# Patient Record
Sex: Female | Born: 1955 | Race: White | Hispanic: No | Marital: Single | Smoking: Former smoker
Health system: Southern US, Community
[De-identification: ages and names within clinical notes are randomized; demographics above are authoritative.]

## PROBLEM LIST (undated history)

## (undated) DIAGNOSIS — T8859XA Other complications of anesthesia, initial encounter: Secondary | ICD-10-CM

## (undated) DIAGNOSIS — F329 Major depressive disorder, single episode, unspecified: Secondary | ICD-10-CM

## (undated) DIAGNOSIS — G8929 Other chronic pain: Secondary | ICD-10-CM

## (undated) DIAGNOSIS — T7840XA Allergy, unspecified, initial encounter: Secondary | ICD-10-CM

## (undated) DIAGNOSIS — G4700 Insomnia, unspecified: Secondary | ICD-10-CM

## (undated) DIAGNOSIS — E785 Hyperlipidemia, unspecified: Secondary | ICD-10-CM

## (undated) DIAGNOSIS — M2540 Effusion, unspecified joint: Secondary | ICD-10-CM

## (undated) DIAGNOSIS — J302 Other seasonal allergic rhinitis: Secondary | ICD-10-CM

## (undated) DIAGNOSIS — M1990 Unspecified osteoarthritis, unspecified site: Secondary | ICD-10-CM

## (undated) DIAGNOSIS — R350 Frequency of micturition: Secondary | ICD-10-CM

## (undated) DIAGNOSIS — J45909 Unspecified asthma, uncomplicated: Secondary | ICD-10-CM

## (undated) DIAGNOSIS — H269 Unspecified cataract: Secondary | ICD-10-CM

## (undated) DIAGNOSIS — E079 Disorder of thyroid, unspecified: Secondary | ICD-10-CM

## (undated) DIAGNOSIS — R200 Anesthesia of skin: Secondary | ICD-10-CM

## (undated) DIAGNOSIS — IMO0001 Reserved for inherently not codable concepts without codable children: Secondary | ICD-10-CM

## (undated) DIAGNOSIS — M2550 Pain in unspecified joint: Secondary | ICD-10-CM

## (undated) DIAGNOSIS — N189 Chronic kidney disease, unspecified: Secondary | ICD-10-CM

## (undated) DIAGNOSIS — F32A Depression, unspecified: Secondary | ICD-10-CM

## (undated) DIAGNOSIS — M62838 Other muscle spasm: Secondary | ICD-10-CM

## (undated) DIAGNOSIS — Z8709 Personal history of other diseases of the respiratory system: Secondary | ICD-10-CM

## (undated) DIAGNOSIS — T4145XA Adverse effect of unspecified anesthetic, initial encounter: Secondary | ICD-10-CM

## (undated) DIAGNOSIS — K219 Gastro-esophageal reflux disease without esophagitis: Secondary | ICD-10-CM

## (undated) DIAGNOSIS — M549 Dorsalgia, unspecified: Secondary | ICD-10-CM

## (undated) DIAGNOSIS — R3915 Urgency of urination: Secondary | ICD-10-CM

## (undated) DIAGNOSIS — G4730 Sleep apnea, unspecified: Secondary | ICD-10-CM

## (undated) DIAGNOSIS — E119 Type 2 diabetes mellitus without complications: Secondary | ICD-10-CM

## (undated) HISTORY — DX: Chronic kidney disease, unspecified: N18.9

## (undated) HISTORY — DX: Disorder of thyroid, unspecified: E07.9

## (undated) HISTORY — PX: CARPAL TUNNEL RELEASE: SHX101

## (undated) HISTORY — DX: Allergy, unspecified, initial encounter: T78.40XA

## (undated) HISTORY — PX: COLONOSCOPY: SHX174

## (undated) HISTORY — DX: Sleep apnea, unspecified: G47.30

## (undated) HISTORY — PX: OTHER SURGICAL HISTORY: SHX169

---
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
## 2001-08-22 ENCOUNTER — Encounter: Payer: Self-pay | Admitting: Family Medicine

## 2001-08-22 ENCOUNTER — Encounter: Admission: RE | Admit: 2001-08-22 | Discharge: 2001-08-22 | Payer: Self-pay | Admitting: Family Medicine

## 2002-06-15 ENCOUNTER — Ambulatory Visit (HOSPITAL_BASED_OUTPATIENT_CLINIC_OR_DEPARTMENT_OTHER): Admission: RE | Admit: 2002-06-15 | Discharge: 2002-06-15 | Payer: Self-pay | Admitting: Neurology

## 2002-08-16 ENCOUNTER — Ambulatory Visit (HOSPITAL_BASED_OUTPATIENT_CLINIC_OR_DEPARTMENT_OTHER): Admission: RE | Admit: 2002-08-16 | Discharge: 2002-08-16 | Payer: Self-pay | Admitting: Neurology

## 2002-10-17 ENCOUNTER — Encounter: Payer: Self-pay | Admitting: Family Medicine

## 2002-10-17 ENCOUNTER — Encounter: Admission: RE | Admit: 2002-10-17 | Discharge: 2002-10-17 | Payer: Self-pay | Admitting: Family Medicine

## 2003-06-15 HISTORY — PX: OTHER SURGICAL HISTORY: SHX169

## 2003-11-14 ENCOUNTER — Ambulatory Visit (HOSPITAL_COMMUNITY): Admission: RE | Admit: 2003-11-14 | Discharge: 2003-11-15 | Payer: Self-pay | Admitting: Orthopedic Surgery

## 2004-02-18 ENCOUNTER — Emergency Department (HOSPITAL_COMMUNITY): Admission: EM | Admit: 2004-02-18 | Discharge: 2004-02-18 | Payer: Self-pay | Admitting: Emergency Medicine

## 2005-06-25 ENCOUNTER — Ambulatory Visit (HOSPITAL_COMMUNITY): Admission: RE | Admit: 2005-06-25 | Discharge: 2005-06-25 | Payer: Self-pay | Admitting: Family Medicine

## 2005-09-13 ENCOUNTER — Encounter: Admission: RE | Admit: 2005-09-13 | Discharge: 2005-09-13 | Payer: Self-pay | Admitting: Family Medicine

## 2005-09-14 ENCOUNTER — Encounter (HOSPITAL_COMMUNITY): Admission: RE | Admit: 2005-09-14 | Discharge: 2005-10-14 | Payer: Self-pay | Admitting: Orthopedic Surgery

## 2006-05-18 ENCOUNTER — Encounter: Admission: RE | Admit: 2006-05-18 | Discharge: 2006-05-18 | Payer: Self-pay | Admitting: Family Medicine

## 2006-06-09 ENCOUNTER — Ambulatory Visit: Payer: Self-pay | Admitting: Internal Medicine

## 2007-06-15 HISTORY — PX: GASTRIC BYPASS: SHX52

## 2007-10-18 ENCOUNTER — Ambulatory Visit: Payer: Self-pay | Admitting: Internal Medicine

## 2007-11-01 ENCOUNTER — Ambulatory Visit: Payer: Self-pay | Admitting: Internal Medicine

## 2009-11-28 ENCOUNTER — Encounter: Admission: RE | Admit: 2009-11-28 | Discharge: 2009-11-28 | Payer: Self-pay | Admitting: Family Medicine

## 2010-07-05 ENCOUNTER — Encounter: Payer: Self-pay | Admitting: Family Medicine

## 2010-10-30 NOTE — Op Note (Signed)
[name], Cristina Hansen                       ACCOUNT NO.:  [id number]   MEDICAL RECORD NO.:  [id number]                   PATIENT TYPE:  OIB   LOCATION:  2899                                 FACILITY:  [facility]   PHYSICIAN:  Nadara Mustard, M.D.                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   PROCEDURES:  1. Left knee unicompartmental arthroplasty medial joint line.  2. Partial patellectomy left knee.   SURGEON:  Nadara Mustard, M.D.   ANESTHESIA:  General endotracheal plus femoral block.   ESTIMATED BLOOD LOSS:  Minimal.   ANTIBIOTICS:  One gram of Kefzol.   TOURNIQUET TIME:  Sixty-nine minutes at 350 mmHg.   DISPOSITION:  To PACU in stable condition.   INDICATIONS FOR PROCEDURE:  The patient is a 55 year old woman with a  unicompartmental osteoarthritis of her left knee.  She has a varus collapse  with bone-on-bone contact on the medial joint line.  She has failed  conservative care and presents at this time for unicompartmental  arthroplasty.  The risks and benefits were discussed including infection,  neurovascular injury, persistent pain, loosening of the components, need for  additional surgery, revision to a total knee, DVT and pulmonary embolus.  The patient stated she understood and wished to proceed at this time.   DESCRIPTION OF PROCEDURE:  The patient was brought to OR room 5 undergoing a  femoral block.  The patient then underwent a general endotracheal  anesthetic.  After an adequate level of anesthesia was obtained the patient  was placed on the operating table.  The foot of the bed was dropped.  The  right lower extremity was wrapped with an Ace wrap and was placed over a  padded knee pad and the left lower  extremity was prepped with dura-prep and  draped into a sterile field with a thigh holder to hold the knee so the knee  could bend in flexion.  [name] was used to cover all exposed skin.  The leg  was elevated and a tourniquet inflated above the thigh to 350 mmHg.  A  medial parapatellar incision was made starting just inferior to the superior  pole of the patella and extending down towards the tibial tubercle.  This  was carried through the retinaculum.  The patella was identified and a  partial patellectomy was performed.  Attention was then focused on the  tibial component.  The external alignment guide was used.  This was pinned  and the stylus was set for the depth.  The 6 mm cutting block was placed and  the sagittal and transverse cuts were made with the cutting block.  With the  tibial cut made the 9 mm spacer fit securely within the gap.  The meniscal  tissue was removed.  Attention was then focused on the femur.  The femoral  small and extra small sizing guides were used and a small sizing guide was  used.  This came to the tide mark.  This was pinned.  The posterior chamfer  cut was made.  The keel cut was made and the drill cuts were made after it  was pinned.  The pins were removed and the router guide was then placed.  The router cuts were made and then after the router guide was removed the  router cuts were debrided further with the rongeur.  The trial components  were then placed with the trial femoral component and the trial tibial  component.  The femoral component was already aligned with the axis of the  rotation centered in the femoral head.  The components were placed and the  patient had a full extension and full flexion with the tongue depressor  blade fitting snuggly in both flexion and extension.  The trial components  were removed.  The medium tibial component had the best fit.  The keel  cutting guide was then used and the keel cut was made for the tibial   component.  The wound was then irrigated with pulse lavage.  Further  debridement of posterior meniscal remnants was performed.  The cement was  mixed and the components were cemented with the medium tibial component  cemented first, followed by the small femoral component.  These were held  secure with the knee in approximately 30 degrees of flexion with the tongue  depressor blade between the components.  Loose cement was removed and this  was held in position until the cement hardened.  A freer was used to remove  any loose cement and pulse lavage was used to further irrigate the wound.  After the cement had hardened the tongue depressor blade was removed, the  knee was placed through a range of motion.  She had range of motion from 0  to 120 degrees.  The tourniquet was deflated after 69 minutes.  Hemostasis  was obtained.  The retinaculum was closed using #1 Vicryl.  The subcu was  closed using 2-0 Vicryl.  The skin was closed using approximate staples.  The wound was covered with adaptic, orthopedic sponges, sterile Webril and a  Coban dressing.  The patient was extubated and taken to the PACU in stable  condition, planned for 23-hour observation.                                               Nadara Mustard, M.D.    MVD/MEDQ  D:  11/14/2003  T:  11/14/2003  Job:  [id number]

## 2010-12-23 ENCOUNTER — Other Ambulatory Visit: Payer: Self-pay | Admitting: Family Medicine

## 2010-12-23 DIAGNOSIS — Z1231 Encounter for screening mammogram for malignant neoplasm of breast: Secondary | ICD-10-CM

## 2011-01-04 ENCOUNTER — Ambulatory Visit
Admission: RE | Admit: 2011-01-04 | Discharge: 2011-01-04 | Disposition: A | Discharge status: Home / Self Care | Payer: Medicare Other | Source: Ambulatory Visit | Attending: Family Medicine | Admitting: Family Medicine

## 2011-01-04 DIAGNOSIS — Z1231 Encounter for screening mammogram for malignant neoplasm of breast: Secondary | ICD-10-CM

## 2013-04-13 ENCOUNTER — Other Ambulatory Visit: Payer: Self-pay

## 2013-04-13 ENCOUNTER — Other Ambulatory Visit: Payer: Self-pay | Admitting: Family Medicine

## 2013-04-13 DIAGNOSIS — Z1231 Encounter for screening mammogram for malignant neoplasm of breast: Secondary | ICD-10-CM

## 2013-04-13 DIAGNOSIS — Z9884 Bariatric surgery status: Secondary | ICD-10-CM

## 2013-04-13 DIAGNOSIS — N63 Unspecified lump in unspecified breast: Secondary | ICD-10-CM

## 2013-05-14 ENCOUNTER — Ambulatory Visit: Payer: Medicare Other

## 2013-05-16 ENCOUNTER — Ambulatory Visit: Payer: Medicare Other

## 2013-05-16 ENCOUNTER — Ambulatory Visit
Admission: RE | Admit: 2013-05-16 | Discharge: 2013-05-16 | Disposition: A | Discharge status: Home / Self Care | Payer: Medicare Other | Source: Ambulatory Visit | Attending: Family Medicine | Admitting: Family Medicine

## 2013-05-16 ENCOUNTER — Other Ambulatory Visit: Payer: Self-pay | Admitting: Family Medicine

## 2013-05-16 DIAGNOSIS — Z9884 Bariatric surgery status: Secondary | ICD-10-CM

## 2013-05-16 DIAGNOSIS — E2839 Other primary ovarian failure: Secondary | ICD-10-CM

## 2013-05-16 DIAGNOSIS — N63 Unspecified lump in unspecified breast: Secondary | ICD-10-CM

## 2014-02-15 ENCOUNTER — Encounter: Payer: Self-pay | Admitting: Internal Medicine

## 2015-06-03 DIAGNOSIS — G4710 Hypersomnia, unspecified: Secondary | ICD-10-CM | POA: Insufficient documentation

## 2015-06-03 DIAGNOSIS — Z9884 Bariatric surgery status: Secondary | ICD-10-CM | POA: Insufficient documentation

## 2015-06-03 DIAGNOSIS — E559 Vitamin D deficiency, unspecified: Secondary | ICD-10-CM | POA: Insufficient documentation

## 2015-06-03 DIAGNOSIS — G4733 Obstructive sleep apnea (adult) (pediatric): Secondary | ICD-10-CM | POA: Insufficient documentation

## 2015-06-03 DIAGNOSIS — E1129 Type 2 diabetes mellitus with other diabetic kidney complication: Secondary | ICD-10-CM | POA: Insufficient documentation

## 2015-06-03 DIAGNOSIS — F3342 Major depressive disorder, recurrent, in full remission: Secondary | ICD-10-CM | POA: Insufficient documentation

## 2015-06-03 DIAGNOSIS — M4800 Spinal stenosis, site unspecified: Secondary | ICD-10-CM | POA: Insufficient documentation

## 2015-06-03 DIAGNOSIS — K219 Gastro-esophageal reflux disease without esophagitis: Secondary | ICD-10-CM | POA: Insufficient documentation

## 2015-06-03 DIAGNOSIS — J309 Allergic rhinitis, unspecified: Secondary | ICD-10-CM | POA: Insufficient documentation

## 2015-06-03 DIAGNOSIS — G2581 Restless legs syndrome: Secondary | ICD-10-CM | POA: Insufficient documentation

## 2015-06-03 DIAGNOSIS — L309 Dermatitis, unspecified: Secondary | ICD-10-CM | POA: Insufficient documentation

## 2015-06-03 DIAGNOSIS — E785 Hyperlipidemia, unspecified: Secondary | ICD-10-CM | POA: Insufficient documentation

## 2015-06-03 DIAGNOSIS — J45909 Unspecified asthma, uncomplicated: Secondary | ICD-10-CM | POA: Insufficient documentation

## 2015-06-17 ENCOUNTER — Ambulatory Visit: Payer: Self-pay | Admitting: Physician Assistant

## 2015-06-17 NOTE — H&P (Signed)
TOTAL KNEE ADMISSION H&P  Patient is being admitted for right total knee arthroplasty.  Subjective:  Chief Complaint:right knee pain.  HPI: Cristina Hansen, 60 y.o. female, has a history of pain and functional disability in the right knee due to arthritis and has failed non-surgical conservative treatments for greater than 12 weeks to includeNSAID's and/or analgesics, corticosteriod injections, viscosupplementation injections, use of assistive devices and activity modification.  Onset of symptoms was gradual, starting >10 years ago with gradually worsening course since that time. The patient noted no past surgery on the right knee(s).  Patient currently rates pain in the right knee(s) at 10 out of 10 with activity. Patient has night pain, worsening of pain with activity and weight bearing, pain that interferes with activities of daily living, pain with passive range of motion, crepitus and joint swelling.  Patient has evidence of periarticular osteophytes and joint space narrowing by imaging studies.There is no active infection.  There are no active problems to display for this patient.  No past medical history on file.  No past surgical history on file.   (Not in a hospital admission) Allergies not on file  Social History  Substance Use Topics  . Smoking status: Not on file  . Smokeless tobacco: Not on file  . Alcohol Use: Not on file    No family history on file.   Review of Systems  Musculoskeletal: Positive for joint pain.  Psychiatric/Behavioral: Positive for depression.  All other systems reviewed and are negative.   Objective:  Physical Exam  Constitutional: She is oriented to person, place, and time. She appears well-developed and well-nourished. No distress.  HENT:  Head: Normocephalic and atraumatic.  Nose: Nose normal.  Eyes: Conjunctivae and EOM are normal. Pupils are equal, round, and reactive to light.  Neck: Normal range of motion. Neck supple.   Cardiovascular: Normal rate, regular rhythm, normal heart sounds and intact distal pulses.   Respiratory: Effort normal and breath sounds normal. No respiratory distress. She has no wheezes.  GI: Soft. Bowel sounds are normal. She exhibits no distension. There is no tenderness.  Musculoskeletal:       Right knee: She exhibits swelling. She exhibits normal range of motion, no effusion, no erythema, no LCL laxity and no MCL laxity. Tenderness found.  Lymphadenopathy:    She has no cervical adenopathy.  Neurological: She is alert and oriented to person, place, and time. No cranial nerve deficit.  Skin: Skin is warm and dry. No rash noted. No erythema.  Psychiatric: She has a normal mood and affect. Her behavior is normal.    Vital signs in last 24 hours: @VSRANGES @  Labs:   There is no height or weight on file to calculate BMI.   Imaging Review Plain radiographs demonstrate severe degenerative joint disease of the right knee(s). The overall alignment issignificant varus. The bone quality appears to be good for age and reported activity level.  Assessment/Plan:  End stage arthritis, right knee   The patient history, physical examination, clinical judgment of the provider and imaging studies are consistent with end stage degenerative joint disease of the right knee(s) and total knee arthroplasty is deemed medically necessary. The treatment options including medical management, injection therapy arthroscopy and arthroplasty were discussed at length. The risks and benefits of total knee arthroplasty were presented and reviewed. The risks due to aseptic loosening, infection, stiffness, patella tracking problems, thromboembolic complications and other imponderables were discussed. The patient acknowledged the explanation, agreed to proceed with the plan and  consent was signed. Patient is being admitted for inpatient treatment for surgery, pain control, PT, OT, prophylactic antibiotics, VTE  prophylaxis, progressive ambulation and ADL's and discharge planning. The patient is planning to be discharged to skilled nursing facility

## 2015-06-18 ENCOUNTER — Telehealth: Payer: Self-pay

## 2015-06-18 NOTE — Telephone Encounter (Signed)
I called Claiborne Billings with [facility] and left a voicemail asking that she call me back. She faxed a request for clearance for the patient's upcoming right total knee replacement. The patient has not been to our office in several years. There are no office visits in Epic. We will not be able to give clearance for this patient.

## 2015-06-18 NOTE — Telephone Encounter (Signed)
Kelly called back. I explained that The patient has not been seen in our office in years. I also explained that we only have 2 doctors in the office who see sleep patients, and Dr. Jannifer Franklin does not see sleep patients now. She states that the patient needs to have an office visit prior to 07/04/15, the date of the surgery. I advised that if the patient is not being followed by a sleep doctor elsewhere (she is on CPAP), she will need a referral to see one of our sleep doctors. I advised that I am not sure if we can see the patient prior to the 20th, but if they go ahead and send the referral we can try.

## 2015-06-23 ENCOUNTER — Inpatient Hospital Stay (HOSPITAL_COMMUNITY)
Admission: RE | Admit: 2015-06-23 | Discharge: 2015-06-23 | Disposition: A | Discharge status: Home / Self Care | Payer: Self-pay | Source: Ambulatory Visit

## 2015-06-26 ENCOUNTER — Encounter (HOSPITAL_COMMUNITY): Payer: Self-pay

## 2015-06-26 ENCOUNTER — Ambulatory Visit (HOSPITAL_COMMUNITY)
Admission: RE | Admit: 2015-06-26 | Discharge: 2015-06-26 | Disposition: A | Discharge status: Home / Self Care | Payer: Medicare HMO | Source: Ambulatory Visit | Attending: Physician Assistant | Admitting: Physician Assistant

## 2015-06-26 ENCOUNTER — Encounter (HOSPITAL_COMMUNITY)
Admission: RE | Admit: 2015-06-26 | Discharge: 2015-06-26 | Disposition: A | Discharge status: Home / Self Care | Payer: Medicare HMO | Source: Ambulatory Visit | Attending: Orthopedic Surgery | Admitting: Orthopedic Surgery

## 2015-06-26 DIAGNOSIS — M1711 Unilateral primary osteoarthritis, right knee: Secondary | ICD-10-CM | POA: Insufficient documentation

## 2015-06-26 DIAGNOSIS — Z01812 Encounter for preprocedural laboratory examination: Secondary | ICD-10-CM | POA: Diagnosis not present

## 2015-06-26 DIAGNOSIS — Z01818 Encounter for other preprocedural examination: Secondary | ICD-10-CM | POA: Insufficient documentation

## 2015-06-26 HISTORY — DX: Unspecified cataract: H26.9

## 2015-06-26 HISTORY — DX: Other complications of anesthesia, initial encounter: T88.59XA

## 2015-06-26 HISTORY — DX: Other seasonal allergic rhinitis: J30.2

## 2015-06-26 HISTORY — DX: Other chronic pain: G89.29

## 2015-06-26 HISTORY — DX: Pain in unspecified joint: M25.50

## 2015-06-26 HISTORY — DX: Depression, unspecified: F32.A

## 2015-06-26 HISTORY — DX: Personal history of other diseases of the respiratory system: Z87.09

## 2015-06-26 HISTORY — DX: Frequency of micturition: R35.0

## 2015-06-26 HISTORY — DX: Insomnia, unspecified: G47.00

## 2015-06-26 HISTORY — DX: Dorsalgia, unspecified: M54.9

## 2015-06-26 HISTORY — DX: Other muscle spasm: M62.838

## 2015-06-26 HISTORY — DX: Adverse effect of unspecified anesthetic, initial encounter: T41.45XA

## 2015-06-26 HISTORY — DX: Unspecified osteoarthritis, unspecified site: M19.90

## 2015-06-26 HISTORY — DX: Anesthesia of skin: R20.0

## 2015-06-26 HISTORY — DX: Gastro-esophageal reflux disease without esophagitis: K21.9

## 2015-06-26 HISTORY — DX: Urgency of urination: R39.15

## 2015-06-26 HISTORY — DX: Hyperlipidemia, unspecified: E78.5

## 2015-06-26 HISTORY — DX: Effusion, unspecified joint: M25.40

## 2015-06-26 HISTORY — DX: Unspecified asthma, uncomplicated: J45.909

## 2015-06-26 HISTORY — DX: Reserved for inherently not codable concepts without codable children: IMO0001

## 2015-06-26 HISTORY — DX: Type 2 diabetes mellitus without complications: E11.9

## 2015-06-26 HISTORY — DX: Major depressive disorder, single episode, unspecified: F32.9

## 2015-06-26 LAB — PROTIME-INR
INR: 0.99 (ref 0.00–1.49)
Prothrombin Time: 13.3 seconds (ref 11.6–15.2)

## 2015-06-26 LAB — COMPREHENSIVE METABOLIC PANEL
ALK PHOS: 58 U/L (ref 38–126)
ALT: 20 U/L (ref 14–54)
AST: 30 U/L (ref 15–41)
Albumin: 3.7 g/dL (ref 3.5–5.0)
Anion gap: 12 (ref 5–15)
BUN: 11 mg/dL (ref 6–20)
CALCIUM: 9.3 mg/dL (ref 8.9–10.3)
CO2: 24 mmol/L (ref 22–32)
CREATININE: 0.68 mg/dL (ref 0.44–1.00)
Chloride: 105 mmol/L (ref 101–111)
GFR calc non Af Amer: >60 mL/min (ref >60)
GLUCOSE: 98 mg/dL (ref 65–99)
Potassium: 4 mmol/L (ref 3.5–5.1)
SODIUM: 141 mmol/L (ref 135–145)
Total Bilirubin: 0.5 mg/dL (ref 0.3–1.2)
Total Protein: 6.4 g/dL — ABNORMAL LOW (ref 6.5–8.1)

## 2015-06-26 LAB — CBC WITH DIFFERENTIAL/PLATELET
BASOS ABS: 0.1 10*3/uL (ref 0.0–0.1)
BASOS PCT: 1 %
EOS ABS: 0.2 10*3/uL (ref 0.0–0.7)
Eosinophils Relative: 3 %
HCT: 41.2 % (ref 36.0–46.0)
HEMOGLOBIN: 13.2 g/dL (ref 12.0–15.0)
Lymphocytes Relative: 33 %
Lymphs Abs: 2.3 10*3/uL (ref 0.7–4.0)
MCH: 28.9 pg (ref 26.0–34.0)
MCHC: 32 g/dL (ref 30.0–36.0)
MCV: 90.2 fL (ref 78.0–100.0)
MONOS PCT: 7 %
Monocytes Absolute: 0.5 10*3/uL (ref 0.1–1.0)
NEUTROS ABS: 3.8 10*3/uL (ref 1.7–7.7)
NEUTROS PCT: 56 %
Platelets: 299 10*3/uL (ref 150–400)
RBC: 4.57 MIL/uL (ref 3.87–5.11)
RDW: 12.9 % (ref 11.5–15.5)
WBC: 6.8 10*3/uL (ref 4.0–10.5)

## 2015-06-26 LAB — URINALYSIS, ROUTINE W REFLEX MICROSCOPIC
Bilirubin Urine: NEGATIVE
Glucose, UA: NEGATIVE mg/dL
Hgb urine dipstick: NEGATIVE
KETONES UR: NEGATIVE mg/dL
LEUKOCYTES UA: NEGATIVE
NITRITE: NEGATIVE
PROTEIN: NEGATIVE mg/dL
Specific Gravity, Urine: 1.012 (ref 1.005–1.030)
pH: 5.5 (ref 5.0–8.0)

## 2015-06-26 LAB — TYPE AND SCREEN
ABO/RH(D): A POS
Antibody Screen: NEGATIVE

## 2015-06-26 LAB — SURGICAL PCR SCREEN
MRSA, PCR: NEGATIVE
Staphylococcus aureus: NEGATIVE

## 2015-06-26 LAB — ABO/RH: ABO/RH(D): A POS

## 2015-06-26 LAB — GLUCOSE, CAPILLARY: GLUCOSE-CAPILLARY: 119 mg/dL — AB (ref 65–99)

## 2015-06-26 LAB — APTT: APTT: 26 s (ref 24–37)

## 2015-06-26 MED ORDER — CHLORHEXIDINE GLUCONATE 4 % EX LIQD: 60.0000 mL | Freq: Once | CUTANEOUS | Status: DC

## 2015-06-26 NOTE — Pre-Procedure Instructions (Signed)
Cristina Hansen  06/26/2015      [id number] - [place], [place] [address] AT [facility] [address] [place] [address] Phone: [phone] Fax: [phone]    Your procedure is scheduled on Fri, Jan 20 @ 10:40 AM  Report to [facility] Admitting at 8:40 AM  Call this number if you have problems the morning of surgery:  [phone]   Remember:  Do not eat food or drink liquids after midnight.  Take these medicines the morning of surgery with A SIP OF WATER Albuterol<Bring Your Inhaler With You>,Wellbutrin(Bupropion),Zyrtec(Cetirizine),Flovent(Fluticasone),Pain Pill(if needed),Omeprazole(Prilosec),and Paxil(Paroxetine)               Stop taking your Fish Oil and Aspirin along with any Vitamins or Herbal Medications. No Goody's,BC's,Aleve,Advil,Motrin,Ibuprofen   Do not wear jewelry, make-up or nail polish.  Do not wear lotions, powders, or perfumes.  You may wear deodorant.  Do not shave 48 hours prior to surgery.    Do not bring valuables to the hospital.  [facility] is not responsible for any belongings or valuables.  Contacts, dentures or bridgework may not be worn into surgery.  Leave your suitcase in the car.  After surgery it may be brought to your room.  For patients admitted to the hospital, discharge time will be determined by your treatment team.  Patients discharged the day of surgery will not be allowed to drive home.    Special instructions:  [place] - Preparing for Surgery  Before surgery, you can play an important role.  Because skin is not sterile, your skin needs to be as free of germs as possible.  You can reduce the number of germs on you skin by washing with CHG (chlorahexidine gluconate) soap before surgery.  CHG is an antiseptic cleaner which kills germs and bonds with the skin to continue killing germs even after washing.  Please DO NOT use if you have an allergy to CHG or antibacterial  soaps.  If your skin becomes reddened/irritated stop using the CHG and inform your nurse when you arrive at Short Stay.  Do not shave (including legs and underarms) for at least 48 hours prior to the first CHG shower.  You may shave your face.  Please follow these instructions carefully:   1.  Shower with CHG Soap the night before surgery and the                                morning of Surgery.  2.  If you choose to wash your hair, wash your hair first as usual with your       normal shampoo.  3.  After you shampoo, rinse your hair and body thoroughly to remove the                      Shampoo.  4.  Use CHG as you would any other liquid soap.  You can apply chg directly       to the skin and wash gently with scrungie or a clean washcloth.  5.  Apply the CHG Soap to your body ONLY FROM THE NECK DOWN.        Do not use on open wounds or open sores.  Avoid contact with your eyes,       ears, mouth and genitals (private parts).  Wash genitals (private parts)       with your normal soap.  6.  Wash thoroughly, paying special attention to the area where your surgery        will be performed.  7.  Thoroughly rinse your body with warm water from the neck down.  8.  DO NOT shower/wash with your normal soap after using and rinsing off       the CHG Soap.  9.  Pat yourself dry with a clean towel.            10.  Wear clean pajamas.            11.  Place clean sheets on your bed the night of your first shower and do not        sleep with pets.  Day of Surgery  Do not apply any lotions/deoderants the morning of surgery.  Please wear clean clothes to the hospital/surgery center.    Please read over the following fact sheets that you were given. Pain Booklet, Coughing and Deep Breathing, Blood Transfusion Information, MRSA Information and Surgical Site Infection Prevention

## 2015-06-26 NOTE — Progress Notes (Addendum)
Denies having a cardiologist  Stress test done prior to 2009  Unsure if she has ever had an echo but if so prior to 2009  Denies ever having a heart cath  Denies CXR in past yr   EKG requested from [facility]

## 2015-06-27 LAB — HEMOGLOBIN A1C
HEMOGLOBIN A1C: 6.1 % — AB (ref 4.8–5.6)
MEAN PLASMA GLUCOSE: 128 mg/dL

## 2015-06-28 LAB — URINE CULTURE: Culture: >=100000

## 2015-07-03 MED ORDER — TRANEXAMIC ACID 1000 MG/10ML IV SOLN
1000.0000 mg | INTRAVENOUS | Status: AC
  Administered 2015-07-04: 1000 mg via INTRAVENOUS
  Filled 2015-07-03: qty 10

## 2015-07-03 MED ORDER — SODIUM CHLORIDE 0.9 % IV SOLN
INTRAVENOUS | Status: DC
  Administered 2015-07-04: 16:00:00 via INTRAVENOUS

## 2015-07-03 MED ORDER — CEFAZOLIN SODIUM-DEXTROSE 2-3 GM-% IV SOLR
2.0000 g | INTRAVENOUS | Status: AC
  Administered 2015-07-04: 2 g via INTRAVENOUS
  Filled 2015-07-03: qty 50

## 2015-07-04 ENCOUNTER — Inpatient Hospital Stay (HOSPITAL_COMMUNITY): Payer: Medicare HMO | Admitting: Certified Registered Nurse Anesthetist

## 2015-07-04 ENCOUNTER — Inpatient Hospital Stay (HOSPITAL_COMMUNITY)
Admission: RE | Admit: 2015-07-04 | Discharge: 2015-07-07 | DRG: 470 | Disposition: A | Discharge status: Skilled Nursing Facility | Payer: Medicare HMO | Source: Ambulatory Visit | Attending: Orthopedic Surgery | Admitting: Orthopedic Surgery

## 2015-07-04 ENCOUNTER — Encounter (HOSPITAL_COMMUNITY): Payer: Self-pay | Admitting: General Practice

## 2015-07-04 ENCOUNTER — Encounter (HOSPITAL_COMMUNITY)
Admission: RE | Disposition: A | Discharge status: Skilled Nursing Facility | Payer: Self-pay | Source: Ambulatory Visit | Attending: Orthopedic Surgery

## 2015-07-04 DIAGNOSIS — M1711 Unilateral primary osteoarthritis, right knee: Principal | ICD-10-CM | POA: Diagnosis present

## 2015-07-04 DIAGNOSIS — J45909 Unspecified asthma, uncomplicated: Secondary | ICD-10-CM | POA: Diagnosis present

## 2015-07-04 DIAGNOSIS — E119 Type 2 diabetes mellitus without complications: Secondary | ICD-10-CM | POA: Diagnosis present

## 2015-07-04 DIAGNOSIS — K219 Gastro-esophageal reflux disease without esophagitis: Secondary | ICD-10-CM | POA: Diagnosis present

## 2015-07-04 DIAGNOSIS — Z6841 Body Mass Index (BMI) 40.0 and over, adult: Secondary | ICD-10-CM | POA: Diagnosis not present

## 2015-07-04 DIAGNOSIS — E669 Obesity, unspecified: Secondary | ICD-10-CM | POA: Diagnosis present

## 2015-07-04 DIAGNOSIS — F329 Major depressive disorder, single episode, unspecified: Secondary | ICD-10-CM | POA: Diagnosis present

## 2015-07-04 DIAGNOSIS — Z87891 Personal history of nicotine dependence: Secondary | ICD-10-CM | POA: Diagnosis not present

## 2015-07-04 DIAGNOSIS — M25561 Pain in right knee: Secondary | ICD-10-CM | POA: Diagnosis present

## 2015-07-04 HISTORY — PX: TOTAL KNEE ARTHROPLASTY: SHX125

## 2015-07-04 LAB — CBC
HEMATOCRIT: 34.8 % — AB (ref 36.0–46.0)
Hemoglobin: 11.2 g/dL — ABNORMAL LOW (ref 12.0–15.0)
MCH: 28.5 pg (ref 26.0–34.0)
MCHC: 32.2 g/dL (ref 30.0–36.0)
MCV: 88.5 fL (ref 78.0–100.0)
Platelets: 268 10*3/uL (ref 150–400)
RBC: 3.93 MIL/uL (ref 3.87–5.11)
RDW: 12.9 % (ref 11.5–15.5)
WBC: 8.8 10*3/uL (ref 4.0–10.5)

## 2015-07-04 LAB — GLUCOSE, CAPILLARY
GLUCOSE-CAPILLARY: 88 mg/dL (ref 65–99)
Glucose-Capillary: 96 mg/dL (ref 65–99)

## 2015-07-04 LAB — CREATININE, SERUM: Creatinine, Ser: 0.8 mg/dL (ref 0.44–1.00)

## 2015-07-04 SURGERY — ARTHROPLASTY, KNEE, TOTAL
Anesthesia: Regional | Site: Knee | Laterality: Right

## 2015-07-04 MED ORDER — DIPHENHYDRAMINE HCL 12.5 MG/5ML PO ELIX: 12.5000 mg | ORAL_SOLUTION | ORAL | Status: DC | PRN

## 2015-07-04 MED ORDER — OXYCODONE HCL 5 MG PO TABS
5.0000 mg | ORAL_TABLET | Freq: Once | ORAL | Status: AC | PRN
  Administered 2015-07-04: 5 mg via ORAL

## 2015-07-04 MED ORDER — LIDOCAINE HCL (CARDIAC) 20 MG/ML IV SOLN
INTRAVENOUS | Status: DC | PRN
  Administered 2015-07-04: 50 mg via INTRATRACHEAL

## 2015-07-04 MED ORDER — DOCUSATE SODIUM 100 MG PO CAPS
100.0000 mg | ORAL_CAPSULE | Freq: Two times a day (BID) | ORAL | Status: DC
  Administered 2015-07-04 – 2015-07-07 (×6): 100 mg via ORAL
  Filled 2015-07-04 (×6): qty 1

## 2015-07-04 MED ORDER — ZOLPIDEM TARTRATE 5 MG PO TABS: 5.0000 mg | ORAL_TABLET | Freq: Every evening | ORAL | Status: DC | PRN

## 2015-07-04 MED ORDER — MENTHOL 3 MG MT LOZG: 1.0000 | LOZENGE | OROMUCOSAL | Status: DC | PRN

## 2015-07-04 MED ORDER — BUPIVACAINE IN DEXTROSE 0.75-8.25 % IT SOLN
INTRATHECAL | Status: DC | PRN
  Administered 2015-07-04: 1.8 mL via INTRATHECAL

## 2015-07-04 MED ORDER — ENOXAPARIN SODIUM 30 MG/0.3ML ~~LOC~~ SOLN
30.0000 mg | Freq: Two times a day (BID) | SUBCUTANEOUS | Status: DC
  Administered 2015-07-05 – 2015-07-07 (×5): 30 mg via SUBCUTANEOUS
  Filled 2015-07-04 (×5): qty 0.3

## 2015-07-04 MED ORDER — MIDAZOLAM HCL 2 MG/2ML IJ SOLN
INTRAMUSCULAR | Status: AC
  Filled 2015-07-04: qty 2

## 2015-07-04 MED ORDER — PROPOFOL 500 MG/50ML IV EMUL
INTRAVENOUS | Status: DC | PRN
  Administered 2015-07-04: 75 ug/kg/min via INTRAVENOUS

## 2015-07-04 MED ORDER — BISACODYL 10 MG RE SUPP: 10.0000 mg | Freq: Every day | RECTAL | Status: DC | PRN

## 2015-07-04 MED ORDER — ACETAMINOPHEN 325 MG PO TABS: 325.0000 mg | ORAL_TABLET | ORAL | Status: DC | PRN

## 2015-07-04 MED ORDER — POLYETHYLENE GLYCOL 3350 17 G PO PACK: 17.0000 g | PACK | Freq: Every day | ORAL | Status: DC | PRN

## 2015-07-04 MED ORDER — MIDAZOLAM HCL 5 MG/5ML IJ SOLN
INTRAMUSCULAR | Status: DC | PRN
  Administered 2015-07-04: 2 mg via INTRAVENOUS

## 2015-07-04 MED ORDER — FENTANYL CITRATE (PF) 250 MCG/5ML IJ SOLN
INTRAMUSCULAR | Status: AC
  Filled 2015-07-04: qty 5

## 2015-07-04 MED ORDER — HYDROMORPHONE HCL 1 MG/ML IJ SOLN
1.0000 mg | INTRAMUSCULAR | Status: DC | PRN
  Administered 2015-07-04 – 2015-07-07 (×7): 1 mg via INTRAVENOUS
  Filled 2015-07-04 (×7): qty 1

## 2015-07-04 MED ORDER — ALBUTEROL SULFATE HFA 108 (90 BASE) MCG/ACT IN AERS: 2.0000 | INHALATION_SPRAY | RESPIRATORY_TRACT | Status: DC | PRN

## 2015-07-04 MED ORDER — PANTOPRAZOLE SODIUM 40 MG PO TBEC: 40.0000 mg | DELAYED_RELEASE_TABLET | Freq: Every day | ORAL | Status: DC

## 2015-07-04 MED ORDER — HYDROMORPHONE HCL 1 MG/ML IJ SOLN
INTRAMUSCULAR | Status: AC
  Administered 2015-07-04: 0.5 mg via INTRAVENOUS
  Filled 2015-07-04: qty 1

## 2015-07-04 MED ORDER — ACETAMINOPHEN 325 MG PO TABS: 650.0000 mg | ORAL_TABLET | Freq: Four times a day (QID) | ORAL | Status: DC | PRN

## 2015-07-04 MED ORDER — LOSARTAN POTASSIUM 25 MG PO TABS
25.0000 mg | ORAL_TABLET | Freq: Every day | ORAL | Status: DC
  Administered 2015-07-05 – 2015-07-07 (×3): 25 mg via ORAL
  Filled 2015-07-04 (×3): qty 1

## 2015-07-04 MED ORDER — FENTANYL CITRATE (PF) 100 MCG/2ML IJ SOLN: 100.0000 ug | Freq: Once | INTRAMUSCULAR | Status: AC

## 2015-07-04 MED ORDER — PHENYLEPHRINE HCL 10 MG/ML IJ SOLN
INTRAMUSCULAR | Status: DC | PRN
  Administered 2015-07-04: 80 ug via INTRAVENOUS
  Administered 2015-07-04: 120 ug via INTRAVENOUS
  Administered 2015-07-04: 40 ug via INTRAVENOUS
  Administered 2015-07-04 (×2): 80 ug via INTRAVENOUS

## 2015-07-04 MED ORDER — VITAMIN D 1000 UNITS PO TABS
10000.0000 [IU] | ORAL_TABLET | Freq: Every evening | ORAL | Status: DC
  Administered 2015-07-05 – 2015-07-07 (×2): 10000 [IU] via ORAL
  Filled 2015-07-04 (×2): qty 10

## 2015-07-04 MED ORDER — METOCLOPRAMIDE HCL 5 MG/ML IJ SOLN: 5.0000 mg | Freq: Three times a day (TID) | INTRAMUSCULAR | Status: DC | PRN

## 2015-07-04 MED ORDER — ONDANSETRON HCL 4 MG PO TABS: 4.0000 mg | ORAL_TABLET | Freq: Four times a day (QID) | ORAL | Status: DC | PRN

## 2015-07-04 MED ORDER — BUPIVACAINE-EPINEPHRINE (PF) 0.5% -1:200000 IJ SOLN
INTRAMUSCULAR | Status: DC | PRN
  Administered 2015-07-04: 25 mL via PERINEURAL

## 2015-07-04 MED ORDER — CYCLOBENZAPRINE HCL 10 MG PO TABS
10.0000 mg | ORAL_TABLET | Freq: Three times a day (TID) | ORAL | Status: DC | PRN
  Administered 2015-07-04 – 2015-07-07 (×5): 10 mg via ORAL
  Filled 2015-07-04 (×5): qty 1

## 2015-07-04 MED ORDER — LACTATED RINGERS IV SOLN
INTRAVENOUS | Status: DC
  Administered 2015-07-04 (×2): via INTRAVENOUS

## 2015-07-04 MED ORDER — ACETAMINOPHEN 160 MG/5ML PO SOLN
325.0000 mg | ORAL | Status: DC | PRN
  Filled 2015-07-04: qty 20.3

## 2015-07-04 MED ORDER — SODIUM CHLORIDE 0.9 % IV SOLN
INTRAVENOUS | Status: DC
  Administered 2015-07-04 – 2015-07-05 (×2): via INTRAVENOUS

## 2015-07-04 MED ORDER — FENTANYL CITRATE (PF) 100 MCG/2ML IJ SOLN
INTRAMUSCULAR | Status: AC
  Administered 2015-07-04: 100 ug
  Filled 2015-07-04: qty 2

## 2015-07-04 MED ORDER — OXYCODONE HCL 5 MG PO TABS
ORAL_TABLET | ORAL | Status: AC
  Administered 2015-07-04: 5 mg via ORAL
  Filled 2015-07-04: qty 1

## 2015-07-04 MED ORDER — FLEET ENEMA 7-19 GM/118ML RE ENEM: 1.0000 | ENEMA | Freq: Once | RECTAL | Status: DC | PRN

## 2015-07-04 MED ORDER — VITAMIN B-12 1000 MCG PO TABS
1000.0000 ug | ORAL_TABLET | Freq: Every day | ORAL | Status: DC
  Administered 2015-07-05 – 2015-07-07 (×3): 1000 ug via ORAL
  Filled 2015-07-04 (×3): qty 1

## 2015-07-04 MED ORDER — HYDROMORPHONE HCL 1 MG/ML IJ SOLN
0.2500 mg | INTRAMUSCULAR | Status: DC | PRN
  Administered 2015-07-04 (×3): 0.5 mg via INTRAVENOUS

## 2015-07-04 MED ORDER — CEFAZOLIN SODIUM-DEXTROSE 2-3 GM-% IV SOLR
2.0000 g | Freq: Four times a day (QID) | INTRAVENOUS | Status: AC
  Administered 2015-07-04 (×2): 2 g via INTRAVENOUS
  Filled 2015-07-04 (×2): qty 50

## 2015-07-04 MED ORDER — MODAFINIL 100 MG PO TABS
300.0000 mg | ORAL_TABLET | Freq: Every day | ORAL | Status: DC
  Administered 2015-07-05 – 2015-07-07 (×3): 300 mg via ORAL
  Filled 2015-07-04 (×3): qty 3

## 2015-07-04 MED ORDER — MIDAZOLAM HCL 2 MG/2ML IJ SOLN: 2.0000 mg | Freq: Once | INTRAMUSCULAR | Status: AC

## 2015-07-04 MED ORDER — MIDAZOLAM HCL 2 MG/2ML IJ SOLN
INTRAMUSCULAR | Status: AC
  Administered 2015-07-04: 2 mg
  Filled 2015-07-04: qty 2

## 2015-07-04 MED ORDER — SODIUM CHLORIDE 0.9 % IR SOLN
Status: DC | PRN
  Administered 2015-07-04: 1000 mL

## 2015-07-04 MED ORDER — ONDANSETRON HCL 4 MG/2ML IJ SOLN
4.0000 mg | Freq: Four times a day (QID) | INTRAMUSCULAR | Status: DC | PRN
  Administered 2015-07-05: 4 mg via INTRAVENOUS
  Filled 2015-07-04: qty 2

## 2015-07-04 MED ORDER — HYDROMORPHONE HCL 1 MG/ML IJ SOLN
INTRAMUSCULAR | Status: AC
  Filled 2015-07-04: qty 1

## 2015-07-04 MED ORDER — ACETAMINOPHEN 650 MG RE SUPP: 650.0000 mg | Freq: Four times a day (QID) | RECTAL | Status: DC | PRN

## 2015-07-04 MED ORDER — ADULT MULTIVITAMIN W/MINERALS CH
1.0000 | ORAL_TABLET | Freq: Every day | ORAL | Status: DC
  Administered 2015-07-05 – 2015-07-07 (×3): 1 via ORAL
  Filled 2015-07-04 (×4): qty 1

## 2015-07-04 MED ORDER — VITAMIN D 1000 UNITS PO TABS
5000.0000 [IU] | ORAL_TABLET | Freq: Every morning | ORAL | Status: DC
  Administered 2015-07-05 – 2015-07-07 (×3): 5000 [IU] via ORAL
  Filled 2015-07-04 (×4): qty 5

## 2015-07-04 MED ORDER — PHENYLEPHRINE 40 MCG/ML (10ML) SYRINGE FOR IV PUSH (FOR BLOOD PRESSURE SUPPORT)
PREFILLED_SYRINGE | INTRAVENOUS | Status: AC
  Filled 2015-07-04: qty 10

## 2015-07-04 MED ORDER — B COMPLEX PO TABS: 1.0000 | ORAL_TABLET | Freq: Every day | ORAL | Status: DC

## 2015-07-04 MED ORDER — ENOXAPARIN SODIUM 30 MG/0.3ML ~~LOC~~ SOLN: 30.0000 mg | Freq: Two times a day (BID) | SUBCUTANEOUS | Status: DC

## 2015-07-04 MED ORDER — LORATADINE 10 MG PO TABS
10.0000 mg | ORAL_TABLET | Freq: Every day | ORAL | Status: DC
  Administered 2015-07-06 – 2015-07-07 (×2): 10 mg via ORAL
  Filled 2015-07-04 (×2): qty 1

## 2015-07-04 MED ORDER — PHENYLEPHRINE HCL 10 MG/ML IJ SOLN
10.0000 mg | INTRAVENOUS | Status: DC | PRN
  Administered 2015-07-04: 40 ug/min via INTRAVENOUS

## 2015-07-04 MED ORDER — HYDROCODONE-ACETAMINOPHEN 7.5-325 MG PO TABS
1.0000 | ORAL_TABLET | ORAL | Status: DC | PRN
  Administered 2015-07-04 – 2015-07-07 (×15): 2 via ORAL
  Filled 2015-07-04 (×15): qty 2

## 2015-07-04 MED ORDER — LIDOCAINE HCL (CARDIAC) 20 MG/ML IV SOLN
INTRAVENOUS | Status: AC
  Filled 2015-07-04: qty 5

## 2015-07-04 MED ORDER — PHENOL 1.4 % MT LIQD: 1.0000 | OROMUCOSAL | Status: DC | PRN

## 2015-07-04 MED ORDER — B COMPLEX-C PO TABS
1.0000 | ORAL_TABLET | Freq: Every day | ORAL | Status: DC
  Administered 2015-07-06 – 2015-07-07 (×2): 1 via ORAL
  Filled 2015-07-04 (×4): qty 1

## 2015-07-04 MED ORDER — METOCLOPRAMIDE HCL 5 MG PO TABS: 5.0000 mg | ORAL_TABLET | Freq: Three times a day (TID) | ORAL | Status: DC | PRN

## 2015-07-04 MED ORDER — HYDROCODONE-ACETAMINOPHEN 7.5-325 MG PO TABS: 1.0000 | ORAL_TABLET | ORAL | Status: DC | PRN

## 2015-07-04 MED ORDER — ALBUTEROL SULFATE (2.5 MG/3ML) 0.083% IN NEBU: 2.5000 mg | INHALATION_SOLUTION | RESPIRATORY_TRACT | Status: DC | PRN

## 2015-07-04 MED ORDER — BUPROPION HCL ER (SR) 100 MG PO TB12
200.0000 mg | ORAL_TABLET | Freq: Two times a day (BID) | ORAL | Status: DC
  Administered 2015-07-04 – 2015-07-07 (×7): 200 mg via ORAL
  Filled 2015-07-04 (×9): qty 2

## 2015-07-04 MED ORDER — OXYCODONE HCL 5 MG/5ML PO SOLN: 5.0000 mg | Freq: Once | ORAL | Status: AC | PRN

## 2015-07-04 MED ORDER — PAROXETINE HCL 20 MG PO TABS
40.0000 mg | ORAL_TABLET | ORAL | Status: DC
  Administered 2015-07-05 – 2015-07-07 (×3): 40 mg via ORAL
  Filled 2015-07-04 (×4): qty 2

## 2015-07-04 SURGICAL SUPPLY — 68 items
ANCH SUT 2 CP-2 EBND QANCHR+ (Anchor) ×1 IMPLANT
ANCHOR SUPER QUICK (Anchor) ×2 IMPLANT
BANDAGE ELASTIC 4 VELCRO ST LF (GAUZE/BANDAGES/DRESSINGS) ×3 IMPLANT
BANDAGE ELASTIC 6 VELCRO ST LF (GAUZE/BANDAGES/DRESSINGS) ×3 IMPLANT
BANDAGE ESMARK 6X9 LF (GAUZE/BANDAGES/DRESSINGS) ×1 IMPLANT
BLADE SAGITTAL 25.0X1.19X90 (BLADE) ×2 IMPLANT
BLADE SAGITTAL 25.0X1.19X90MM (BLADE) ×1
BLADE SAW SAG 90X13X1.27 (BLADE) ×3 IMPLANT
BNDG CMPR 9X6 STRL LF SNTH (GAUZE/BANDAGES/DRESSINGS) ×1
BNDG ESMARK 6X9 LF (GAUZE/BANDAGES/DRESSINGS) ×3
BONE CEMENT GENTAMICIN (Cement) ×6 IMPLANT
BOWL SMART MIX CTS (DISPOSABLE) ×3 IMPLANT
CAP KNEE TOTAL 3 SIGMA ×2 IMPLANT
CEMENT BONE GENTAMICIN 40 (Cement) IMPLANT
COVER SURGICAL LIGHT HANDLE (MISCELLANEOUS) ×3 IMPLANT
CUFF TOURNIQUET SINGLE 34IN LL (TOURNIQUET CUFF) ×3 IMPLANT
CUFF TOURNIQUET SINGLE 44IN (TOURNIQUET CUFF) IMPLANT
DRAPE INCISE IOBAN 66X45 STRL (DRAPES) IMPLANT
DRAPE ORTHO SPLIT 77X108 STRL (DRAPES) ×6
DRAPE SURG ORHT 6 SPLT 77X108 (DRAPES) ×2 IMPLANT
DRAPE U-SHAPE 47X51 STRL (DRAPES) ×3 IMPLANT
DRSG ADAPTIC 3X8 NADH LF (GAUZE/BANDAGES/DRESSINGS) ×3 IMPLANT
DRSG PAD ABDOMINAL 8X10 ST (GAUZE/BANDAGES/DRESSINGS) ×6 IMPLANT
DURAPREP 26ML APPLICATOR (WOUND CARE) ×3 IMPLANT
ELECT REM PT RETURN 9FT ADLT (ELECTROSURGICAL) ×3
ELECTRODE REM PT RTRN 9FT ADLT (ELECTROSURGICAL) ×1 IMPLANT
EVACUATOR 1/8 PVC DRAIN (DRAIN) IMPLANT
FACESHIELD WRAPAROUND (MASK) ×6 IMPLANT
FACESHIELD WRAPAROUND OR TEAM (MASK) ×2 IMPLANT
FLOSEAL 10ML (HEMOSTASIS) IMPLANT
GAUZE SPONGE 4X4 12PLY STRL (GAUZE/BANDAGES/DRESSINGS) ×3 IMPLANT
GLOVE BIOGEL PI IND STRL 8 (GLOVE) ×4 IMPLANT
GLOVE BIOGEL PI INDICATOR 8 (GLOVE) ×8
GLOVE ORTHO TXT STRL SZ7.5 (GLOVE) ×3 IMPLANT
GLOVE SURG ORTHO 8.0 STRL STRW (GLOVE) ×3 IMPLANT
GOWN STRL REUS W/ TWL LRG LVL3 (GOWN DISPOSABLE) ×2 IMPLANT
GOWN STRL REUS W/ TWL XL LVL3 (GOWN DISPOSABLE) ×1 IMPLANT
GOWN STRL REUS W/TWL 2XL LVL3 (GOWN DISPOSABLE) ×3 IMPLANT
GOWN STRL REUS W/TWL LRG LVL3 (GOWN DISPOSABLE) ×6
GOWN STRL REUS W/TWL XL LVL3 (GOWN DISPOSABLE) ×3
HANDPIECE INTERPULSE COAX TIP (DISPOSABLE) ×3
HOOD PEEL AWAY FACE SHEILD DIS (HOOD) ×3 IMPLANT
IMMOBILIZER KNEE 22 UNIV (SOFTGOODS) ×2 IMPLANT
KIT BASIN OR (CUSTOM PROCEDURE TRAY) ×3 IMPLANT
KIT ROOM TURNOVER OR (KITS) ×3 IMPLANT
MANIFOLD NEPTUNE II (INSTRUMENTS) ×3 IMPLANT
NEEDLE 22X1 1/2 (OR ONLY) (NEEDLE) IMPLANT
NS IRRIG 1000ML POUR BTL (IV SOLUTION) ×3 IMPLANT
PACK TOTAL JOINT (CUSTOM PROCEDURE TRAY) ×3 IMPLANT
PACK UNIVERSAL I (CUSTOM PROCEDURE TRAY) ×3 IMPLANT
PAD ARMBOARD 7.5X6 YLW CONV (MISCELLANEOUS) ×6 IMPLANT
PAD CAST 4YDX4 CTTN HI CHSV (CAST SUPPLIES) ×1 IMPLANT
PADDING CAST COTTON 4X4 STRL (CAST SUPPLIES) ×3
PADDING CAST COTTON 6X4 STRL (CAST SUPPLIES) ×3 IMPLANT
SET HNDPC FAN SPRY TIP SCT (DISPOSABLE) ×1 IMPLANT
SPONGE GAUZE 4X4 12PLY STER LF (GAUZE/BANDAGES/DRESSINGS) ×2 IMPLANT
STAPLER VISISTAT 35W (STAPLE) ×3 IMPLANT
SUCTION FRAZIER TIP 10 FR DISP (SUCTIONS) ×3 IMPLANT
SUT ETHIBOND NAB CT1 #1 30IN (SUTURE) ×9 IMPLANT
SUT VIC AB 0 CT1 27 (SUTURE) ×3
SUT VIC AB 0 CT1 27XBRD ANBCTR (SUTURE) ×1 IMPLANT
SUT VIC AB 2-0 CT1 27 (SUTURE) ×6
SUT VIC AB 2-0 CT1 TAPERPNT 27 (SUTURE) ×2 IMPLANT
SYR CONTROL 10ML LL (SYRINGE) IMPLANT
TOWEL OR 17X24 6PK STRL BLUE (TOWEL DISPOSABLE) ×3 IMPLANT
TOWEL OR 17X26 10 PK STRL BLUE (TOWEL DISPOSABLE) ×3 IMPLANT
TRAY FOLEY CATH 16FRSI W/METER (SET/KITS/TRAYS/PACK) IMPLANT
WATER STERILE IRR 1000ML POUR (IV SOLUTION) ×1 IMPLANT

## 2015-07-04 NOTE — H&P (View-Only) (Signed)
TOTAL KNEE ADMISSION H&P  Patient is being admitted for right total knee arthroplasty.  Subjective:  Chief Complaint:right knee pain.  HPI: Cristina Hansen, 60 y.o. female, has a history of pain and functional disability in the right knee due to arthritis and has failed non-surgical conservative treatments for greater than 12 weeks to includeNSAID's and/or analgesics, corticosteriod injections, viscosupplementation injections, use of assistive devices and activity modification.  Onset of symptoms was gradual, starting >10 years ago with gradually worsening course since that time. The patient noted no past surgery on the right knee(s).  Patient currently rates pain in the right knee(s) at 10 out of 10 with activity. Patient has night pain, worsening of pain with activity and weight bearing, pain that interferes with activities of daily living, pain with passive range of motion, crepitus and joint swelling.  Patient has evidence of periarticular osteophytes and joint space narrowing by imaging studies.There is no active infection.  There are no active problems to display for this patient.  No past medical history on file.  No past surgical history on file.   (Not in a hospital admission) Allergies not on file  Social History  Substance Use Topics  . Smoking status: Not on file  . Smokeless tobacco: Not on file  . Alcohol Use: Not on file    No family history on file.   Review of Systems  Musculoskeletal: Positive for joint pain.  Psychiatric/Behavioral: Positive for depression.  All other systems reviewed and are negative.   Objective:  Physical Exam  Constitutional: She is oriented to person, place, and time. She appears well-developed and well-nourished. No distress.  HENT:  Head: Normocephalic and atraumatic.  Nose: Nose normal.  Eyes: Conjunctivae and EOM are normal. Pupils are equal, round, and reactive to light.  Neck: Normal range of motion. Neck supple.   Cardiovascular: Normal rate, regular rhythm, normal heart sounds and intact distal pulses.   Respiratory: Effort normal and breath sounds normal. No respiratory distress. She has no wheezes.  GI: Soft. Bowel sounds are normal. She exhibits no distension. There is no tenderness.  Musculoskeletal:       Right knee: She exhibits swelling. She exhibits normal range of motion, no effusion, no erythema, no LCL laxity and no MCL laxity. Tenderness found.  Lymphadenopathy:    She has no cervical adenopathy.  Neurological: She is alert and oriented to person, place, and time. No cranial nerve deficit.  Skin: Skin is warm and dry. No rash noted. No erythema.  Psychiatric: She has a normal mood and affect. Her behavior is normal.    Vital signs in last 24 hours: @VSRANGES @  Labs:   There is no height or weight on file to calculate BMI.   Imaging Review Plain radiographs demonstrate severe degenerative joint disease of the right knee(s). The overall alignment issignificant varus. The bone quality appears to be good for age and reported activity level.  Assessment/Plan:  End stage arthritis, right knee   The patient history, physical examination, clinical judgment of the provider and imaging studies are consistent with end stage degenerative joint disease of the right knee(s) and total knee arthroplasty is deemed medically necessary. The treatment options including medical management, injection therapy arthroscopy and arthroplasty were discussed at length. The risks and benefits of total knee arthroplasty were presented and reviewed. The risks due to aseptic loosening, infection, stiffness, patella tracking problems, thromboembolic complications and other imponderables were discussed. The patient acknowledged the explanation, agreed to proceed with the plan and  consent was signed. Patient is being admitted for inpatient treatment for surgery, pain control, PT, OT, prophylactic antibiotics, VTE  prophylaxis, progressive ambulation and ADL's and discharge planning. The patient is planning to be discharged to skilled nursing facility

## 2015-07-04 NOTE — Brief Op Note (Signed)
07/04/2015  1:00 PM  PATIENT:  Cristina Hansen  60 y.o. female  PRE-OPERATIVE DIAGNOSIS:  OA RIGHT KNEE  POST-OPERATIVE DIAGNOSIS:  SAME  PROCEDURE:  Procedure(s): TOTAL RIGHT KNEE ARTHROPLASTY (Right)  SURGEON:  Surgeon(s) and Role:    * Earlie Server, MD - Primary  PHYSICIAN ASSISTANT: Chriss Czar, PA-C  ASSISTANTS:   ANESTHESIA:   spinal and IV sedation  EBL:  Total I/O In: 700 [I.V.:700] Out: 200 [Blood:200]  BLOOD ADMINISTERED:none  DRAINS: 1 hemovac drain lateral right knee self suction   LOCAL MEDICATIONS USED:  NONE  SPECIMEN:  No Specimen  DISPOSITION OF SPECIMEN:  N/A  COUNTS:  YES  TOURNIQUET:   Total Tourniquet Time Documented: Thigh (Right) - 72 minutes Total: Thigh (Right) - 72 minutes   DICTATION: .Other Dictation: Dictation Number unknown  PLAN OF CARE: Admit to inpatient   PATIENT DISPOSITION:  PACU - hemodynamically stable.   Delay start of Pharmacological VTE agent (>24hrs) due to surgical blood loss or risk of bleeding: yes

## 2015-07-04 NOTE — Anesthesia Preprocedure Evaluation (Signed)
Anesthesia Evaluation  Patient identified by MRN, date of birth, ID band Patient awake    History of Anesthesia Complications (+) history of anesthetic complications  Airway Mallampati: II  TM Distance: >3 FB   Mouth opening: Limited Mouth Opening  Dental  (+) Teeth Intact, Dental Advisory Given   Pulmonary asthma , former smoker,    breath sounds clear to auscultation + decreased breath sounds      Cardiovascular  Rhythm:Regular Rate:Normal     Neuro/Psych PSYCHIATRIC DISORDERS Depression    GI/Hepatic GERD  Medicated and Controlled,  Endo/Other  diabetes, Type 2  Renal/GU      Musculoskeletal  (+) Arthritis ,   Abdominal (+) + obese,   Peds  Hematology   Anesthesia Other Findings   Reproductive/Obstetrics                             Anesthesia Physical Anesthesia Plan  ASA: III  Anesthesia Plan: Regional and Spinal   Post-op Pain Management: MAC Combined w/ Regional for Post-op pain   Induction:   Airway Management Planned: Natural Airway and Simple Face Mask  Additional Equipment:   Intra-op Plan:   Post-operative Plan:   Informed Consent: I have reviewed the patients History and Physical, chart, labs and discussed the procedure including the risks, benefits and alternatives for the proposed anesthesia with the patient or authorized representative who has indicated his/her understanding and acceptance.   Dental advisory given  Plan Discussed with:   Anesthesia Plan Comments:         Anesthesia Quick Evaluation

## 2015-07-04 NOTE — Anesthesia Postprocedure Evaluation (Signed)
Anesthesia Post Note  Patient: Cristina Hansen  Procedure(s) Performed: Procedure(s) (LRB): TOTAL RIGHT KNEE ARTHROPLASTY (Right)  Patient location during evaluation: PACU Anesthesia Type: Spinal and Regional Level of consciousness: awake Pain management: pain level controlled Vital Signs Assessment: post-procedure vital signs reviewed and stable Respiratory status: spontaneous breathing Cardiovascular status: stable Postop Assessment: spinal receding and no signs of nausea or vomiting Anesthetic complications: no    Last Vitals:  Filed Vitals:   07/04/15 1345 07/04/15 1400  BP: 119/61 132/59  Pulse: 71 71  Temp:    Resp: 16 19    Last Pain:  Filed Vitals:   07/04/15 1431  PainSc: 4                  Chantee Cerino

## 2015-07-04 NOTE — Progress Notes (Signed)
Utilization review completed.  

## 2015-07-04 NOTE — Interval H&P Note (Signed)
History and Physical Interval Note:  07/04/2015 7:28 AM  Cristina Hansen  has presented today for surgery, with the diagnosis of OA RIGHT KNEE  The various methods of treatment have been discussed with the patient and family. After consideration of risks, benefits and other options for treatment, the patient has consented to  Procedure(s): TOTAL RIGHT KNEE ARTHROPLASTY (Right) as a surgical intervention .  The patient's history has been reviewed, patient examined, no change in status, stable for surgery.  I have reviewed the patient's chart and labs.  Questions were answered to the patient's satisfaction.     Albi Rappaport JR,W D

## 2015-07-04 NOTE — Transfer of Care (Signed)
Immediate Anesthesia Transfer of Care Note  Patient: Cristina Hansen  Procedure(s) Performed: Procedure(s): TOTAL RIGHT KNEE ARTHROPLASTY (Right)  Patient Location: PACU  Anesthesia Type:MAC and Spinal  Level of Consciousness: awake, alert , oriented, patient cooperative and responds to stimulation  Airway & Oxygen Therapy: Patient Spontanous Breathing  Post-op Assessment: Report given to RN and Post -op Vital signs reviewed and stable  Post vital signs: Reviewed and stable  Last Vitals:  Filed Vitals:   07/04/15 1010 07/04/15 1330  BP: 109/67 116/69  Pulse: 72 69  Temp:  36.6 C  Resp: 16 16    Complications: No apparent anesthesia complications

## 2015-07-04 NOTE — Evaluation (Signed)
Physical Therapy Evaluation Patient Details Name: Cristina Hansen MRN: [id number] DOB: [birth year] Today's Date: 07/04/2015   History of Present Illness  Patient is a 60 yo female admitted 07/04/15 now s/p Rt TKA.    PMH: OA, depression, obesity  Clinical Impression  Patient presents with problems listed below.  Will benefit from acute PT to maximize functional mobility prior to discharge.  Patient lives alone.  Recommend ST-SNF for continued therapy at d/c.    Follow Up Recommendations SNF;Supervision/Assistance - 24 hour    Equipment Recommendations  Rolling walker with 5" wheels    Recommendations for Other Services       Precautions / Restrictions Precautions Precautions: Knee Precaution Booklet Issued: Yes (comment) Precaution Comments: Reviewed precautions with patient Required Braces or Orthoses: Knee Immobilizer - Right Knee Immobilizer - Right: On when out of bed or walking (Until spinal wears off) Restrictions Weight Bearing Restrictions: Yes RLE Weight Bearing: Weight bearing as tolerated      Mobility  Bed Mobility Overal bed mobility: Needs Assistance Bed Mobility: Supine to Sit;Sit to Supine     Supine to sit: Mod assist;HOB elevated Sit to supine: Mod assist   General bed mobility comments: Verbal cues for technique.  Assist to move RLE off of bed and to raise trunk to sitting position.  Once upright, patient with good sitting balance.  Sat EOB x 10 minutes.  Assist to bring LE's onto bed to return to supine.  Transfers                 General transfer comment: Declined  Ambulation/Gait                Stairs            Wheelchair Mobility    Modified Rankin (Stroke Patients Only)       Balance Overall balance assessment: Needs assistance Sitting-balance support: No upper extremity supported;Feet supported Sitting balance-Leahy Scale: Good                                       Pertinent Vitals/Pain Pain  Assessment: 0-10 Pain Score: 8  Pain Location: RLE - mid-thigh to foot Pain Descriptors / Indicators: Aching;Sore Pain Intervention(s): Monitored during session;Repositioned;Patient requesting pain meds-RN notified;RN gave pain meds during session;Ice applied    Home Living Family/patient expects to be discharged to:: Skilled nursing facility ([facility]) Living Arrangements: Alone             Home Equipment: Bedside commode;Shower seat;Cane - single point      Prior Function Level of Independence: Independent               Hand Dominance        Extremity/Trunk Assessment   Upper Extremity Assessment: Overall WFL for tasks assessed           Lower Extremity Assessment: RLE deficits/detail RLE Deficits / Details: Decreased strength and ROM due to pain/surgery    Cervical / Trunk Assessment: Normal  Communication   Communication: No difficulties  Cognition Arousal/Alertness: Awake/alert Behavior During Therapy: WFL for tasks assessed/performed Overall Cognitive Status: Within Functional Limits for tasks assessed                      General Comments      Exercises Total Joint Exercises Ankle Circles/Pumps: AROM;Both;10 reps;Supine Quad Sets: AROM;Right;5 reps;Supine      Assessment/Plan  PT Assessment Patient needs continued PT services  PT Diagnosis Difficulty walking;Generalized weakness;Acute pain   PT Problem List Decreased strength;Decreased range of motion;Decreased activity tolerance;Decreased balance;Decreased mobility;Decreased knowledge of use of DME;Decreased knowledge of precautions;Obesity;Pain  PT Treatment Interventions DME instruction;Gait training;Functional mobility training;Therapeutic activities;Therapeutic exercise;Patient/family education   PT Goals (Current goals can be found in the Care Plan section) Acute Rehab PT Goals Patient Stated Goal: To decrease pain PT Goal Formulation: With patient Time For Goal  Achievement: 07/11/15 Potential to Achieve Goals: Good    Frequency 7X/week   Barriers to discharge Decreased caregiver support Patient lives alone    Co-evaluation               End of Session   Activity Tolerance: Patient limited by pain Patient left: in bed;with call bell/phone within reach Nurse Communication: Mobility status;Patient requests pain meds         Time: 1850-1925 PT Time Calculation (min) (ACUTE ONLY): 35 min   Charges:   PT Evaluation $PT Eval Moderate Complexity: 1 Procedure PT Treatments $Therapeutic Activity: 8-22 mins   PT G Codes:        Despina Pole 07-27-2015, 8:22 PM Carita Pian. Sanjuana Kava, Kay Pager [phone]

## 2015-07-04 NOTE — Progress Notes (Signed)
Orthopedic Tech Progress Note Patient Details:  Cristina Hansen July 14, 1955 [id number]  CPM Right Knee CPM Right Knee: On Right Knee Flexion (Degrees): 90 Right Knee Extension (Degrees): 0 Additional Comments: Trapeze bar   [name] 07/04/2015, 3:10 PM

## 2015-07-04 NOTE — Progress Notes (Signed)
Rt Note: Spoke with pt about use of CPAP. Per pt request she would lke to hold off on CPAP here at this time. She states she would just use nasal Cannula if she needs anything while here.Pt unsure of current settings.  I told her to let Rt know if she changes her mind.

## 2015-07-04 NOTE — Anesthesia Procedure Notes (Signed)
Anesthesia Regional Block:  Femoral nerve block  Pre-Anesthetic Checklist: ,, timeout performed, Correct Patient, Correct Site, Correct Laterality, Correct Procedure, Correct Position, site marked, Risks and benefits discussed,  Surgical consent,  Pre-op evaluation,  At surgeon's request and post-op pain management  Laterality: Lower and Right  Prep: chloraprep       Needles:  Injection technique: Single-shot  Needle Type: Echogenic Stimulator Needle          Additional Needles:  Procedures: ultrasound guided (picture in chart) and nerve stimulator Femoral nerve block  Nerve Stimulator or Paresthesia:  Response: quad, 0.5 mA,   Additional Responses:   Narrative:  Injection made incrementally with aspirations every 5 mL.  Performed by: Personally  Anesthesiologist: Jaylanni Eltringham  Additional Notes: H+P and labs reviewed, risks and benefits discussed with patient, procedure tolerated well without complications   Spinal Patient location during procedure: OR Staffing Anesthesiologist: Adaia Matthies Preanesthetic Checklist Completed: patient identified, surgical consent, pre-op evaluation, timeout performed, IV checked, risks and benefits discussed and monitors and equipment checked Spinal Block Patient position: sitting Prep: site prepped and draped and DuraPrep Patient monitoring: heart rate, cardiac monitor, continuous pulse ox and blood pressure Approach: midline Location: L3-4 Injection technique: single-shot Needle Needle type: Pencan  Needle gauge: 24 G Needle length: 10 cm Assessment Sensory level: T4

## 2015-07-04 NOTE — Op Note (Signed)
NAMEEVON, SIAM NO.:  [id number]  MEDICAL RECORD NO.:  [id number]  LOCATION:  5N15C                        FACILITY:  [facility]  PHYSICIAN:  Lockie Pares, M.D.    DATE OF BIRTH:  [birth year]  DATE OF PROCEDURE:  07/04/2015 DATE OF DISCHARGE:                              OPERATIVE REPORT   PREOPERATIVE DIAGNOSIS:  Severe osteoarthritis, right knee with flexion contracture and multiple loose bodies.  POSTOPERATIVE DIAGNOSIS:  Severe osteoarthritis, right knee with flexion contracture and multiple loose bodies.  OPERATION:  Right total knee replacement (Sigma cemented knee, size 3 femur, size 2.5 tibia with 12.5-mm bearing and 35-mm all poly patella).  SURGEON:  Lockie Pares, M.D.  ASSISTANT:  Chriss Czar, PA-C.  ANESTHESIA:  Spinal with nerve block.  TOURNIQUET TIME:  75 minutes.  DESCRIPTION OF PROCEDURE:  Sterile prep and drape, exsanguination of leg, inflation to 350, had a 15-20 degrees flexion contracture with significant bone loss along the tibia.  We cut the femur with 11-mm and eventually 12-mm 5-degree valgus cut.  Cut below the most diseased medial compartment about 3-4 mm, but despite this, we had an extension gap issue relative to not obtaining full extension, cut an additional 2 mm of resection, obtained full extension.  Collateral ligaments were carefully balanced.  The femur was sized to be a size 3 followed by placement of the all-in-1 cutting block, appropriate degree of external rotation and cutting the anterior, posterior and chamfer cuts.  Flexion gap was measured at eventually 12.5 mm equal to extension gap.  PCL was released.  Multiple loose bodies on each side of the knee and some were attached to the soft tissues, probably 8 or 9 of these were removed with some posterior osteophytes as well.  We then directed our attention to cutting the keel for the tibia, which was done.  Trial was placed.  Box was cut for the femur.  We  then obtained full extension with 12.5-mm bearing with good balancing with the flexion gap, no tendency for bearing spin out and good balance of the collateral ligaments.  Patella was cut, leaving about 16 mm of native patella.  All poly trial placed, all trials were placed, deemed to be acceptable relative to full extension, good balancing of the ligaments.  Then, placed the final components, tibia followed by femur patella, we allowed the cement to harden with trial bearing.  Trial bearing was removed.  Tourniquet was released, before this, no excess cement was noted in the posterior aspect of the knee.  Likewise, there was no excessive bleeding.  Small bleeders were coagulated.  Hemovac drain placed in the superolateral gutter. Closure was effected with #1 Ethibond with the addition of Mitek anchor for some attenuation of the patellar tendon.  Closure was effected with render of the parapatellar structures with #1 Ethibond, 2-0 Vicryl and skin clips.  Lightly compressive sterile dressing, knee immobilizer applied, taken to the recovery room in stable condition.     Lockie Pares, M.D.     WDC/MEDQ  D:  07/04/2015  T:  07/04/2015  Job:  [id number]

## 2015-07-05 LAB — BASIC METABOLIC PANEL
ANION GAP: 7 (ref 5–15)
BUN: 9 mg/dL (ref 6–20)
CALCIUM: 8.7 mg/dL — AB (ref 8.9–10.3)
CO2: 26 mmol/L (ref 22–32)
Chloride: 103 mmol/L (ref 101–111)
Creatinine, Ser: 0.8 mg/dL (ref 0.44–1.00)
GFR calc Af Amer: >60 mL/min (ref >60)
GFR calc non Af Amer: >60 mL/min (ref >60)
GLUCOSE: 116 mg/dL — AB (ref 65–99)
Potassium: 4.4 mmol/L (ref 3.5–5.1)
Sodium: 136 mmol/L (ref 135–145)

## 2015-07-05 LAB — CBC
HEMATOCRIT: 35.7 % — AB (ref 36.0–46.0)
Hemoglobin: 11.8 g/dL — ABNORMAL LOW (ref 12.0–15.0)
MCH: 29.9 pg (ref 26.0–34.0)
MCHC: 33.1 g/dL (ref 30.0–36.0)
MCV: 90.4 fL (ref 78.0–100.0)
PLATELETS: 256 10*3/uL (ref 150–400)
RBC: 3.95 MIL/uL (ref 3.87–5.11)
RDW: 13.2 % (ref 11.5–15.5)
WBC: 8.5 10*3/uL (ref 4.0–10.5)

## 2015-07-05 MED ORDER — PANTOPRAZOLE SODIUM 40 MG PO TBEC
40.0000 mg | DELAYED_RELEASE_TABLET | Freq: Every day | ORAL | Status: DC
  Administered 2015-07-05 – 2015-07-07 (×3): 40 mg via ORAL
  Filled 2015-07-05 (×3): qty 1

## 2015-07-05 NOTE — NC FL2 (Signed)
[place] LEVEL OF CARE SCREENING TOOL     IDENTIFICATION  Patient Name: Cristina Hansen Birthdate: 1956/01/12 Sex: female Admission Date (Current Location): 07/04/2015  [facility] and [place] Number:  Herbalist and Address:  [facility], [place] [address] [place], [place] [address]      Provider Number: [id number]  Attending Physician Name and Address:  Earlie Server, MD  Relative Name and Phone Number:       Current Level of Care: Hospital Recommended Level of Care: [place] Prior Approval Number:    Date Approved/Denied:   PASRR Number: [id number]  Discharge Plan: Home    Current Diagnoses: Patient Active Problem List   Diagnosis Date Noted  . Primary localized osteoarthritis of right knee 07/04/2015    Orientation RESPIRATION BLADDER Height & Weight    Self, Time, Situation, Place  O2 (3L) Continent 5\' 1"  (154.9 cm) 239 lbs.  BEHAVIORAL SYMPTOMS/MOOD NEUROLOGICAL BOWEL NUTRITION STATUS      Continent Diet (Reg)  AMBULATORY STATUS COMMUNICATION OF NEEDS Skin   Extensive Assist Verbally Surgical wounds (Incision on leg with compression wrap)                       Personal Care Assistance Level of Assistance  Bathing, Feeding, Dressing Bathing Assistance: Maximum assistance Feeding assistance: Independent Dressing Assistance: Maximum assistance     Functional Limitations Info  Sight, Hearing, Speech Sight Info: Adequate Hearing Info: Adequate Speech Info: Adequate    SPECIAL CARE FACTORS FREQUENCY  OT (By licensed OT), PT (By licensed PT)                    Contractures      Additional Factors Info  Code Status, Allergies Code Status Info: full Allergies Info: latex           Current Medications (07/05/2015):  This is the current hospital active medication list Current Facility-Administered Medications  Medication Dose Route Frequency Provider Last Rate Last Dose  .  acetaminophen (TYLENOL) tablet 650 mg  650 mg Oral Q6H PRN Chriss Czar, PA-C       Or  . acetaminophen (TYLENOL) suppository 650 mg  650 mg Rectal Q6H PRN Joshua Chadwell, PA-C      . albuterol (PROVENTIL) (2.5 MG/3ML) 0.083% nebulizer solution 2.5 mg  2.5 mg Nebulization Q4H PRN Earlie Server, MD      . B-complex with vitamin C tablet 1 tablet  1 tablet Oral Daily Earlie Server, MD      . bisacodyl (DULCOLAX) suppository 10 mg  10 mg Rectal Daily PRN Chriss Czar, PA-C      . buPROPion [facility] SR) 12 hr tablet 200 mg  200 mg Oral BID Joshua Chadwell, PA-C   200 mg at 07/05/15 1033  . cholecalciferol (VITAMIN D) tablet 10,000 Units  10,000 Units Oral QPM Earlie Server, MD      . cholecalciferol (VITAMIN D) tablet 5,000 Units  5,000 Units Oral q morning - 10a Chriss Czar, PA-C   5,000 Units at 07/05/15 1034  . cyclobenzaprine (FLEXERIL) tablet 10 mg  10 mg Oral TID PRN Chriss Czar, PA-C   10 mg at 07/05/15 0900  . diphenhydrAMINE (BENADRYL) 12.5 MG/5ML elixir 12.5-25 mg  12.5-25 mg Oral Q4H PRN Joshua Chadwell, PA-C      . docusate sodium (COLACE) capsule 100 mg  100 mg Oral BID Joshua Chadwell, PA-C   100 mg at 07/05/15 1041  .  enoxaparin (LOVENOX) injection 30 mg  30 mg Subcutaneous Q12H Joshua Chadwell, PA-C   30 mg at 07/05/15 0900  . HYDROcodone-acetaminophen (NORCO) 7.5-325 MG per tablet 1-2 tablet  1-2 tablet Oral Q4H PRN Chriss Czar, PA-C   2 tablet at 07/05/15 0900  . HYDROmorphone (DILAUDID) injection 1 mg  1 mg Intravenous Q2H PRN Chriss Czar, PA-C   1 mg at 07/05/15 1041  . lactated ringers infusion   Intravenous Continuous Oleta Mouse, MD 10 mL/hr at 07/04/15 [phone]    . loratadine (CLARITIN) tablet 10 mg  10 mg Oral Daily Joshua Chadwell, PA-C   10 mg at 07/04/15 1746  . losartan (COZAAR) tablet 25 mg  25 mg Oral Daily Joshua Chadwell, PA-C   25 mg at 07/05/15 1034  . menthol-cetylpyridinium (CEPACOL) lozenge 3 mg  1 lozenge Oral PRN Chriss Czar,  PA-C       Or  . phenol (CHLORASEPTIC) mouth spray 1 spray  1 spray Mouth/Throat PRN Joshua Chadwell, PA-C      . metoCLOPramide (REGLAN) tablet 5-10 mg  5-10 mg Oral Q8H PRN Joshua Chadwell, PA-C       Or  . metoCLOPramide (REGLAN) injection 5-10 mg  5-10 mg Intravenous Q8H PRN Joshua Chadwell, PA-C      . modafinil (PROVIGIL) tablet 300 mg  300 mg Oral Daily Joshua Chadwell, PA-C   300 mg at 07/05/15 0859  . multivitamin with minerals tablet 1 tablet  1 tablet Oral Daily Chriss Czar, PA-C   1 tablet at 07/05/15 1033  . ondansetron (ZOFRAN) tablet 4 mg  4 mg Oral Q6H PRN Chriss Czar, PA-C       Or  . ondansetron (ZOFRAN) injection 4 mg  4 mg Intravenous Q6H PRN Chriss Czar, PA-C   4 mg at 07/05/15 0116  . pantoprazole (PROTONIX) EC tablet 40 mg  40 mg Oral Daily Ainsley Spinner, PA-C   40 mg at 07/05/15 1033  . PARoxetine (PAXIL) tablet 40 mg  40 mg Oral BH-q7a Joshua Chadwell, PA-C   40 mg at 07/05/15 0859  . polyethylene glycol (MIRALAX / GLYCOLAX) packet 17 g  17 g Oral Daily PRN Chriss Czar, PA-C      . sodium phosphate (FLEET) 7-19 GM/118ML enema 1 enema  1 enema Rectal Once PRN Chriss Czar, PA-C      . vitamin B-12 (CYANOCOBALAMIN) tablet 1,000 mcg  1,000 mcg Oral Daily Chriss Czar, PA-C   1,000 mcg at 07/05/15 1034     Discharge Medications: Please see discharge summary for a list of discharge medications.  Relevant Imaging Results:  Relevant Lab Results:   Additional Information    [place] Master, LCSW Weekend Coverage

## 2015-07-05 NOTE — Progress Notes (Signed)
Ortho tech called for bone foam.  Informed bone foams are currently out of stock at this time.  Once shipment arrives, a bone foam will be brought to patient.

## 2015-07-05 NOTE — Clinical Social Work Note (Signed)
Clinical Social Work Assessment  Patient Details  Name: Cristina Hansen MRN: [id number] Date of Birth: [birth year]  Date of referral:  07/05/15               Reason for consult:  Discharge Planning                Permission sought to share information with:  Family Supports Permission granted to share information::  Yes, Verbal Permission Granted  Name::     Higher education careers adviser::     Relationship::  friend  Sport and exercise psychologist Information:     Housing/Transportation Living arrangements for the past 2 months:  Single Family Home Source of Information:  Patient Patient Interpreter Needed:  None Criminal Activity/Legal Involvement Pertinent to Current Situation/Hospitalization:  No - Comment as needed Significant Relationships:  None Lives with:  Self Do you feel safe going back to the place where you live?  No Need for family participation in patient care:  No (Coment)  Care giving concerns:  Pt lives alone and PT recommends SNF placement.   Social Worker assessment / plan:  CSW met with pt to discuss DC plans. Pt reports she had spoken with coordinator at [facility] and planned to go to [facility] for rehab when medically stable.  Pt reports her friend will assist with transportation to SNF. CSW explained process and pt reports understanding.  CSW completed FL2 and PASRR and faxed information to [facility].  Employment status:  Retired Nurse, adult PT Recommendations:  [place] / Referral to community resources:  [place]  Patient/Family's Response to care:  Pt alert and oriented and engaged during assessment.  Patient/Family's Understanding of and Emotional Response to Diagnosis, Current Treatment, and Prognosis:  Pt hopeful for ST SNF and then return home to remain independent.  Emotional Assessment Appearance:  Appears stated age Attitude/Demeanor/Rapport:  Other (Cooperative) Affect (typically observed):   Appropriate Orientation:  Oriented to Self, Oriented to Place, Oriented to  Time, Oriented to Situation Alcohol / Substance use:  Not Applicable Psych involvement (Current and /or in the community):  No (Comment)  Discharge Needs  Concerns to be addressed:  No discharge needs identified Readmission within the last 30 days:  No Current discharge risk:  None Barriers to Discharge:  No Barriers Identified   Boone Master, [place] 07/05/2015, 12:01 PM Weekend Coverage

## 2015-07-05 NOTE — Clinical Social Work Placement (Signed)
   CLINICAL SOCIAL WORK PLACEMENT  NOTE  Date:  07/05/2015  Patient Details  Name: Cristina Hansen MRN: [id number] Date of Birth: 11-Jan-1956  Clinical Social Work is seeking post-discharge placement for this patient at the [place] level of care (*CSW will initial, date and re-position this form in  chart as items are completed):  Yes   Patient/family provided with [place] Work Department's list of facilities offering this level of care within the geographic area requested by the patient (or if unable, by the patient's family).  Yes   Patient/family informed of their freedom to choose among providers that offer the needed level of care, that participate in Medicare, Medicaid or managed care program needed by the patient, have an available bed and are willing to accept the patient.  Yes   Patient/family informed of [facility]'s ownership interest in [facility] and [facility], as well as of the fact that they are under no obligation to receive care at these facilities.  PASRR submitted to EDS on 07/05/15     PASRR number received on 07/05/15     Existing PASRR number confirmed on       FL2 transmitted to all facilities in geographic area requested by pt/family on 07/05/15     FL2 transmitted to all facilities within larger geographic area on       Patient informed that his/her managed care company has contracts with or will negotiate with certain facilities, including the following:            Patient/family informed of bed offers received.  Patient chooses bed at       Physician recommends and patient chooses bed at      Patient to be transferred to   on  .  Patient to be transferred to facility by       Patient family notified on   of transfer.  Name of family member notified:        PHYSICIAN       Additional Comment:    _______________________________________________ Boone Master, [place] 07/05/2015, 12:04  PM

## 2015-07-05 NOTE — Progress Notes (Signed)
Orthopaedic Trauma [facility] Progress Note Weekend Coverage  Subjective  Doing well this am Pain controlled but requiring occasional IV dilaudid  No specific complaints   CPM for 6 hours yesterday and 2.5 hours already this am   Tolerating regular diet Voided last night No BM or flatus right now, last BM was 07/03/2015  Plans to dc to [facility]   Review of Systems  Constitutional: Negative for fever and chills.  Respiratory: Negative for shortness of breath and wheezing.   Cardiovascular: Negative for chest pain and palpitations.  Gastrointestinal: Negative for nausea, vomiting and abdominal pain.  Genitourinary: Negative for dysuria.  Neurological: Negative for tingling, sensory change and headaches.     Objective   BP 130/69 mmHg  Pulse 95  Temp(Src) 98.3 F (36.8 C) (Oral)  Resp 16  Ht _0  (1.549 m)  Wt 108.41 kg (239 lb)  BMI 45.18 kg/m2  SpO2 93%  Intake/Output      01/20 0701 - 01/21 0700 01/21 0701 - 01/22 0700   P.O. 840    I.V. (mL/kg) 1823.3 (16.8)    Total Intake(mL/kg) 2663.3 (24.6)    Urine (mL/kg/hr) 550    Drains 150    Blood 200    Total Output 900     Net +1763.3          Urine Occurrence 1 x      Labs  Results for NYKIRA, REDDIX (MRN [id number]) as of 07/05/2015 09:33  Ref. Range 07/05/2015 03:40  Sodium Latest Ref Range: 135-145 mmol/L 136  Potassium Latest Ref Range: 3.5-5.1 mmol/L 4.4  Chloride Latest Ref Range: 101-111 mmol/L 103  CO2 Latest Ref Range: 22-32 mmol/L 26  BUN Latest Ref Range: 6-20 mg/dL 9  Creatinine Latest Ref Range: 0.44-1.00 mg/dL 0.80  Calcium Latest Ref Range: 8.9-10.3 mg/dL 8.7 (L)  EGFR (Non-African Amer.) Latest Ref Range: >60 mL/min >60  EGFR (African American) Latest Ref Range: >60 mL/min >60  Glucose Latest Ref Range: 65-99 mg/dL 116 (H)  Anion gap Latest Ref Range: 5-15  7  WBC Latest Ref Range: 4.0-10.5 K/uL 8.5  RBC Latest Ref Range: 3.87-5.11 MIL/uL 3.95  Hemoglobin Latest Ref Range:  12.0-15.0 g/dL 11.8 (L)  HCT Latest Ref Range: 36.0-46.0 % 35.7 (L)  MCV Latest Ref Range: 78.0-100.0 fL 90.4  MCH Latest Ref Range: 26.0-34.0 pg 29.9  MCHC Latest Ref Range: 30.0-36.0 g/dL 33.1  RDW Latest Ref Range: 11.5-15.5 % 13.2  Platelets Latest Ref Range: 150-400 K/uL 256    Exam  Gen: awake and alert, NAD, resting comfortably  Lungs: clear anterior fields Cardiac: RRR, s1 and s2 Abd: + BS, NTND Ext:       Right Lower Extremity   Knee resting in about 15 degrees of flexion   Passively extended her to full extension   Dressing c/d/i  Drain patent  DPN, SPN, TN sensation intact  EHL, FHL, AT, PT, peroneals, gastroc motor intact  Swelling stable  Ext warm   + DP pulse  Compartments soft and NT, no pain with passive stretching     Assessment and Plan   POD/HD#: 1  60 y/o female with end-stage DJD R knee s/p R TKA   1. R knee DJD s/p R TKA  WBAT   CPM   Discussed about resting with knee in full extension   Will get zero knee bone foam   Pillow placed under ankle for now  PT/OT evals  Dressing change and dc drain tomorrow  Plan for SNF tomorrow   2. Pain management:  Continue with current regimen   Encourage PO's   3.Hemodynamics  Stable   Vitals stable   4. Obesity   5. Medical issues   No acute issues  Continue with home meds   6. DVT/PE prophylaxis:  lovenox  7. ID:   periop abx  8. Activity:  WBAT   PT/OT evals   9. FEN/GI prophylaxis/Foley/Lines:  Reg diet  NSL IVF    10. Dispo:  Therapy evals  Likely stable for dc to snf tomorrow     Jari Pigg, PA-C [facility] [phone] (O) 07/05/2015 9:31 AM

## 2015-07-05 NOTE — Progress Notes (Signed)
OT Cancellation Note  Patient Details Name: Cristina Hansen MRN: [id number] DOB: Aug 17, 1955   Cancelled Treatment:    Reason Eval/Treat Not Completed:  (OT screened) Pt's current D/C plan is SNF. No apparent immediate acute care OT needs, therefore will defer OT to SNF. If OT eval is needed please call Acute Rehab Dept. at [phone] or text page OT at [phone].    Benito Mccreedy OTR/L [id number] 07/05/2015, 1:27 PM

## 2015-07-05 NOTE — Progress Notes (Signed)
Physical Therapy Treatment Patient Details Name: Cristina Hansen MRN: [id number] DOB: 30-Oct-1955 Today's Date: 07/05/2015    History of Present Illness Patient is a 60 yo female admitted 07/04/15 now s/p Rt TKA.    PMH: OA, depression, obesity    PT Comments    Patient making slow progress with mobility.  Pain limiting factor today.  Continue to recommend SNF for continued therapy at discharge.  Follow Up Recommendations  SNF;Supervision/Assistance - 24 hour     Equipment Recommendations  Rolling walker with 5" wheels    Recommendations for Other Services       Precautions / Restrictions Precautions Precautions: Knee Precaution Booklet Issued: Yes (comment) Precaution Comments: Reviewed precautions with patient Required Braces or Orthoses: Knee Immobilizer - Right Knee Immobilizer - Right: On when out of bed or walking (Until spinal wears off) Restrictions Weight Bearing Restrictions: Yes RLE Weight Bearing: Weight bearing as tolerated    Mobility  Bed Mobility Overal bed mobility: Needs Assistance Bed Mobility: Supine to Sit     Supine to sit: Mod assist;HOB elevated     General bed mobility comments: Instructed patient on donning KI on RLE.  Assist to manage RLE and to raise trunk to upright position.  Patient sat EOB x 6 minutes - slightly lightheaded with transition.  Transfers Overall transfer level: Needs assistance Equipment used: Rolling walker (2 wheeled) Transfers: Sit to/from [facility] Sit to Stand: Mod assist;+2 physical assistance Stand pivot transfers: Min assist;+2 physical assistance       General transfer comment: Verbal cues for hand placement and technique.  Assist to power up to standing, and for balance.  Patient able to take several steps to pivot to chair with assist for balance.  Patient required increased time for all transfers.  Ambulation/Gait                 Stairs            Wheelchair Mobility     Modified Rankin (Stroke Patients Only)       Balance Overall balance assessment: Needs assistance         Standing balance support: Bilateral upper extremity supported Standing balance-Leahy Scale: Poor                      Cognition Arousal/Alertness: Awake/alert Behavior During Therapy: WFL for tasks assessed/performed Overall Cognitive Status: Within Functional Limits for tasks assessed                      Exercises Total Joint Exercises Ankle Circles/Pumps: AROM;Both;10 reps;Seated Quad Sets: AROM;Right;10 reps;Seated Short Arc Quad: AROM;Right;5 reps;Seated Heel Slides: AAROM;Right;5 reps;Seated Goniometric ROM: -15* ext    General Comments        Pertinent Vitals/Pain Pain Assessment: 0-10 Pain Score: 9  Pain Location: Rt knee Pain Descriptors / Indicators: Aching;Constant;Sharp Pain Intervention(s): Limited activity within patient's tolerance;Monitored during session;Repositioned;Patient requesting pain meds-RN notified;RN gave pain meds during session;Ice applied    Home Living                      Prior Function            PT Goals (current goals can now be found in the care plan section) Acute Rehab PT Goals Patient Stated Goal: To decrease pain Progress towards PT goals: Progressing toward goals    Frequency  7X/week    PT Plan Current plan remains appropriate    Co-evaluation  End of Session Equipment Utilized During Treatment: Gait belt;Right knee immobilizer;Oxygen Activity Tolerance: Patient limited by pain Patient left: in chair;with call bell/phone within reach, with foam under Rt heel/lower calf     Time: 1049-1140 PT Time Calculation (min) (ACUTE ONLY): 51 min  Charges:  $Therapeutic Exercise: 8-22 mins $Therapeutic Activity: 23-37 mins                    G Codes:      Despina Pole 07/13/2015, 11:53 AM Carita Pian. Sanjuana Kava, [place] Pager [phone]

## 2015-07-06 LAB — CBC
HCT: 32.8 % — ABNORMAL LOW (ref 36.0–46.0)
Hemoglobin: 10.7 g/dL — ABNORMAL LOW (ref 12.0–15.0)
MCH: 29.4 pg (ref 26.0–34.0)
MCHC: 32.6 g/dL (ref 30.0–36.0)
MCV: 90.1 fL (ref 78.0–100.0)
PLATELETS: 214 10*3/uL (ref 150–400)
RBC: 3.64 MIL/uL — AB (ref 3.87–5.11)
RDW: 12.8 % (ref 11.5–15.5)
WBC: 9.5 10*3/uL (ref 4.0–10.5)

## 2015-07-06 NOTE — Progress Notes (Signed)
Physical Therapy Treatment Patient Details Name: RADHA LAGER MRN: [id number] DOB: [birth year] Today's Date: 07/06/2015    History of Present Illness Patient is a 60 yo female admitted 07/04/15 now s/p Rt TKA.    PMH: OA, depression, obesity    PT Comments    Noting improvements in mobility and activity tolerance, able to perform her first round of walking today; Overall progressing well; Anticipate continuing good progress at post-acute rehabilitation.    Follow Up Recommendations  SNF;Supervision/Assistance - 24 hour     Equipment Recommendations  Rolling walker with 5" wheels    Recommendations for Other Services       Precautions / Restrictions Precautions Precautions: Knee Precaution Booklet Issued: Yes (comment) Precaution Comments: Pt educated to not allow any pillow or bolster under knee for healing with optimal range of motion.  Required Braces or Orthoses: Knee Immobilizer - Right Knee Immobilizer - Right: On when out of bed or walking Restrictions Weight Bearing Restrictions: Yes RLE Weight Bearing: Weight bearing as tolerated    Mobility  Bed Mobility Overal bed mobility: Needs Assistance Bed Mobility: Supine to Sit     Supine to sit: Mod assist;HOB elevated     General bed mobility comments: Instructed patient on donning KI on RLE.  Assist to manage RLE and to raise trunk to upright position.    Transfers Overall transfer level: Needs assistance Equipment used: Rolling walker (2 wheeled) Transfers: Sit to/from Stand Sit to Stand: Mod assist;+2 physical assistance         General transfer comment: Verbal cues for hand placement and technique.  Assist to power up to standing, and for balance.   Ambulation/Gait Ambulation/Gait assistance: +2 safety/equipment;Min assist Ambulation Distance (Feet): 10 Feet Assistive device: Rolling walker (2 wheeled) Gait Pattern/deviations: Step-to pattern;Trunk flexed;Decreased step length - left;Decreased  stance time - right     General Gait Details: Cues for gait sequence and technqiue, including to support self on RW to unweight painful R knee during LLE advancement; she also responded well to cues to take a deep breath in when stepping L    Stairs            Wheelchair Mobility    Modified Rankin (Stroke Patients Only)       Balance     Sitting balance-Leahy Scale: Good       Standing balance-Leahy Scale: Poor                      Cognition Arousal/Alertness: Awake/alert Behavior During Therapy: WFL for tasks assessed/performed Overall Cognitive Status: Within Functional Limits for tasks assessed                      Exercises Total Joint Exercises Quad Sets: AROM;Right;10 reps;Seated Short Arc Quad: AROM;AAROM;Right;10 reps Heel Slides: AAROM;Right;10 reps Straight Leg Raises: AAROM;Right;10 reps Goniometric ROM: approx 5-50 deg    General Comments        Pertinent Vitals/Pain Pain Assessment: 0-10 Pain Score: 10-Worst pain ever Pain Location: Rt knee during therex; pt also noted R shoulder pain after walk Pain Descriptors / Indicators: Aching;Grimacing;Guarding Pain Intervention(s): Limited activity within patient's tolerance;Monitored during session;Premedicated before session    Home Living                      Prior Function            PT Goals (current goals can now be found in the care plan section)  Acute Rehab PT Goals Patient Stated Goal: To decrease pain PT Goal Formulation: With patient Time For Goal Achievement: 07/11/15 Potential to Achieve Goals: Good Progress towards PT goals: Progressing toward goals    Frequency  7X/week    PT Plan Current plan remains appropriate    Co-evaluation             End of Session Equipment Utilized During Treatment: Gait belt;Right knee immobilizer Activity Tolerance: Patient tolerated treatment well Patient left: in chair;with call bell/phone within reach      Time: 0924-1004 PT Time Calculation (min) (ACUTE ONLY): 40 min  Charges:  $Gait Training: 8-22 mins $Therapeutic Exercise: 8-22 mins $Therapeutic Activity: 8-22 mins                    G Codes:      Quin Hoop 07/06/2015, 11:05 AM  Roney Marion, [place] Pager [phone] Office [phone]

## 2015-07-06 NOTE — Progress Notes (Signed)
Orthopaedic Trauma Service (OTS)  Subjective: 2 Days Post-Op Procedure(s) (LRB): TOTAL RIGHT KNEE ARTHROPLASTY (Right) Patient reports pain as mild.    Objective: Current Vitals Blood pressure 136/64, pulse 90, temperature 99.1 F (37.3 C), temperature source Oral, resp. rate 16, height 5\' 1"  (1.549 m), weight 239 lb (108.41 kg), SpO2 91 %. Vital signs in last 24 hours: Temp:  [98.4 F (36.9 C)-99.1 F (37.3 C)] 99.1 F (37.3 C) (01/22 0448) Pulse Rate:  [81-90] 90 (01/22 0448) Resp:  [16] 16 (01/22 0448) BP: (117-136)/(51-64) 136/64 mmHg (01/22 0448) SpO2:  [91 %-93 %] 91 % (01/22 0448)  Intake/Output from previous day: 01/21 0701 - 01/22 0700 In: 1580 [P.O.:1200; I.V.:380] Out: 150 [Drains:150]  LABS  Recent Labs  07/04/15 1836 07/05/15 0340 07/06/15 0750  HGB 11.2* 11.8* 10.7*    Recent Labs  07/05/15 0340 07/06/15 0750  WBC 8.5 9.5  RBC 3.95 3.64*  HCT 35.7* 32.8*  PLT 256 214    Recent Labs  07/04/15 1836 07/05/15 0340  NA  --  136  K  --  4.4  CL  --  103  CO2  --  26  BUN  --  9  CREATININE 0.80 0.80  GLUCOSE  --  116*  CALCIUM  --  8.7*   No results for input(s): LABPT, INR in the last 72 hours.  Physical Exam  Right Lower Extremity  Knee resting in full extension   Dressing changed; wound c/d/i DPN, SPN, TN sensation intact EHL, FHL, AT, PT, peroneals, gastroc motor intact Swelling very mild Ext warm  + DP pulse Compartments soft and NT, no pain with passive stretching    Imaging No results found.  Assessment/Plan: 2 Days Post-Op Procedure(s) (LRB): TOTAL RIGHT KNEE ARTHROPLASTY (Right)  1. PT/ OT 2. D/c to SNF tomorrow, [facility] 3. Lovenox  Altamese Viola, MD [facility] [phone] (p)   07/06/2015, 12:43 PM

## 2015-07-07 ENCOUNTER — Encounter (HOSPITAL_COMMUNITY): Payer: Self-pay | Admitting: Orthopedic Surgery

## 2015-07-07 LAB — CBC
HCT: 31.2 % — ABNORMAL LOW (ref 36.0–46.0)
Hemoglobin: 10.3 g/dL — ABNORMAL LOW (ref 12.0–15.0)
MCH: 29.4 pg (ref 26.0–34.0)
MCHC: 33 g/dL (ref 30.0–36.0)
MCV: 89.1 fL (ref 78.0–100.0)
PLATELETS: 219 10*3/uL (ref 150–400)
RBC: 3.5 MIL/uL — AB (ref 3.87–5.11)
RDW: 12.9 % (ref 11.5–15.5)
WBC: 7.8 10*3/uL (ref 4.0–10.5)

## 2015-07-07 NOTE — Clinical Social Work Note (Addendum)
CSW met with patient at bedside to review bed offers and disposition.  Patient's insurance requires an authorization that Ronney Lion was unable to receive today and states that that authorization process generally takes 2 business days for to receive.  [facility] is unable to accept the patient without auth.  Patient expressed frustration with insurance company regarding this decision.  Patient has discharge orders/summary in.  Patient has offer from [facility] who is willing to take the patient for STR without insurance approval on a 7-day LOG.  This has been approved by Asst CSW Mudlogger.  Gerald Stabs from [facility] was at bedside and reviewed the admission's process with patient.  Patient states she will have family transport her to SNF because her insurance company does not pay for EMS transportation.    Patient will discharge today per MD order. Patient will discharge to [facility] RN to call report prior to transportation to: [phone] Transportation: family SNF is ready for the patient whenever her transportation can transport her.  2:54pm- CSW received call from patient's insurance agent who was asking questions regarding [employer] and SNF.  Agent was unaware of this issue with [employer].  Agent was assured patient was offered STR at time of discharge.  CSW received a call from [place], surgery scheduler with [facility] who states that patient called and complained regarding her not being able to receive STR at [facility].  CSW received a call from [place] at [facility] who reports she received a call from [place] (who received a call from [place] at [employer]) stating that we can hold the patient another day until we receive authorization.  CSW confirmed with liaison that patient was medically cleared.  Ivin Booty from [facility] reported that she spoke with the patient prior to surgery and advised her that [place] [employer] takes 2 days for auth and patient may not be able to get auth prior  to discharge.  CSW spoke with Claiborne Billings at [facility] and explained that there is a facility that would be willing to take her without insurance authorization which is only 10 minutes from her home.  Claiborne Billings states she advised the patient that if she could not go to [facility] that she would need to explore other options.  Patient is to discharge to the [facility] today transportation provided by family.  CSW sent discharge summary to SNF for review.   Nonnie Done, LCSW [phone]  5N1-9, 2S 15-16 and Psychiatric Service Line  Licensed Clinical Social Worker

## 2015-07-07 NOTE — Discharge Summary (Signed)
PATIENT ID: Cristina Hansen        MRN:  [id number]          DOB/AGE: 1955/11/01 / 60 y.o.    DISCHARGE SUMMARY  ADMISSION DATE:    07/04/2015 DISCHARGE DATE:   07/07/2015   ADMISSION DIAGNOSIS: OA RIGHT KNEE    DISCHARGE DIAGNOSIS:  OA RIGHT KNEE    ADDITIONAL DIAGNOSIS: Active Problems:   Primary localized osteoarthritis of right knee  Past Medical History  Diagnosis Date  . Muscle spasm     takes Flexeril daily  . Shortness of breath dyspnea     takes Omeprazole daily  . Insomnia     takes Ambien nightly as needed  . Depression     takes Paxil and Wellbutrin daily  . Complication of anesthesia     After gastric bypass hours later states she started filling up with fluid and had to be given IV Lasix  . Hyperlipidemia     not on any meds.Last cholesterol was 102 end of Nov 2016  . Asthma     Albuterol inhaler daily as needed  . History of bronchitis 17 yrs ago  . Seasonal allergies     Flovent and Zyrtec daily as needed  . Numbness     right 4/5 finger and occasionally left leg d/t back pain  . Arthritis   . Joint pain   . Joint swelling   . Chronic back pain     degenerative arthritis and spondylolisthesis  . GERD (gastroesophageal reflux disease)     takes Omeprazole daily  . Urinary frequency   . Urinary urgency   . Diabetes mellitus without complication ([place])     takes Losartan daily to protect kidneys;was on Metformin but has been off since 2010 after gastric bypass  . Cataract     left eye,immature    PROCEDURE: Procedure(s): TOTAL RIGHT KNEE ARTHROPLASTY Right on 07/04/2015  CONSULTS: PT/OT/SW      HISTORY:  See H&P in chart  HOSPITAL COURSE:  REET ALLAN is a 60 y.o. admitted on 07/04/2015 and found to have a diagnosis of OA RIGHT KNEE.  After appropriate laboratory studies were obtained  they were taken to the operating room on 07/04/2015 and underwent  Procedure(s): TOTAL RIGHT KNEE ARTHROPLASTY  Right.   They were given perioperative  antibiotics:  Anti-infectives    Start     Dose/Rate Route Frequency Ordered Stop   07/04/15 1700  ceFAZolin (ANCEF) IVPB 2 g/50 mL premix     2 g 100 mL/hr over 30 Minutes Intravenous Every 6 hours 07/04/15 1652 07/05/15 0009   07/04/15 0600  ceFAZolin (ANCEF) IVPB 2 g/50 mL premix     2 g 100 mL/hr over 30 Minutes Intravenous On call to O.R. 07/03/15 1351 07/04/15 1019    .  Tolerated the procedure well.    POD #1, allowed out of bed to a chair.  PT for ambulation and exercise program.  IV saline locked.  O2 discontionued.  POD #2, continued PT and ambulation.   Hemovac pulled. . The remainder of the hospital course was dedicated to ambulation and strengthening.   The patient was discharged on 3 Days Post-Op in  Stable condition.  Blood products given:none  DIAGNOSTIC STUDIES: Recent vital signs: Patient Vitals for the past 24 hrs:  BP Temp Temp src Pulse Resp SpO2  07/07/15 0623 128/61 mmHg 98.5 F (36.9 C) Oral 86 16 97 %  07/06/15 2040 (!) 137/57 mmHg 97.8  F (36.6 C) Oral 79 16 97 %  07/06/15 1419 126/62 mmHg 99 F (37.2 C) Oral 82 16 93 %       Recent laboratory studies:  Recent Labs  07/04/15 1836 07/05/15 0340 07/06/15 0750 07/07/15 0604  WBC 8.8 8.5 9.5 7.8  HGB 11.2* 11.8* 10.7* 10.3*  HCT 34.8* 35.7* 32.8* 31.2*  PLT 268 256 214 219    Recent Labs  07/04/15 1836 07/05/15 0340  NA  --  136  K  --  4.4  CL  --  103  CO2  --  26  BUN  --  9  CREATININE 0.80 0.80  GLUCOSE  --  116*  CALCIUM  --  8.7*   Lab Results  Component Value Date   INR 0.99 06/26/2015     Recent Radiographic Studies :  Dg Chest 2 View  06/26/2015  CLINICAL DATA:  Preop right knee replacement EXAM: CHEST  2 VIEW COMPARISON:  11/14/2003 FINDINGS: The heart size and mediastinal contours are within normal limits. Both lungs are clear. The visualized skeletal structures are unremarkable. IMPRESSION: No active cardiopulmonary disease. Electronically Signed   By: Franchot Gallo  M.D.   On: 06/26/2015 15:05    DISCHARGE INSTRUCTIONS:   DISCHARGE MEDICATIONS:     Medication List    STOP taking these medications        aspirin 81 MG tablet     ciprofloxacin 500 MG tablet  Commonly known as:  CIPRO     HYDROcodone-acetaminophen 5-325 MG tablet  Commonly known as:  NORCO/VICODIN  Replaced by:  HYDROcodone-acetaminophen 7.5-325 MG tablet      TAKE these medications        albuterol 108 (90 Base) MCG/ACT inhaler  Commonly known as:  PROVENTIL HFA;VENTOLIN HFA  Inhale 2 puffs into the lungs every 4 (four) hours as needed for wheezing or shortness of breath.     b complex vitamins tablet  Take 1 tablet by mouth daily.     buPROPion 200 MG 12 hr tablet  Commonly known as:  WELLBUTRIN SR  Take 200 mg by mouth 2 (two) times daily.     cetirizine 10 MG tablet  Commonly known as:  ZYRTEC  Take 10 mg by mouth daily as needed for allergies.     cyclobenzaprine 10 MG tablet  Commonly known as:  FLEXERIL  Take 10 mg by mouth 3 (three) times daily as needed for muscle spasms.     enoxaparin 30 MG/0.3ML injection  Commonly known as:  LOVENOX  Inject 0.3 mLs (30 mg total) into the skin every 12 (twelve) hours.     Fish Oil 1000 MG Caps  Take 2,000 mg by mouth daily.     fluticasone 110 MCG/ACT inhaler  Commonly known as:  FLOVENT HFA  Inhale 2 puffs into the lungs 2 (two) times daily.     HYDROcodone-acetaminophen 7.5-325 MG tablet  Commonly known as:  NORCO  Take 1-2 tablets by mouth every 4 (four) hours as needed for moderate pain or severe pain.     losartan 25 MG tablet  Commonly known as:  COZAAR  Take 25 mg by mouth daily.     modafinil 200 MG tablet  Commonly known as:  PROVIGIL  Take 300 mg by mouth daily.     multivitamin with minerals tablet  Take 1 tablet by mouth daily.     omeprazole 20 MG capsule  Commonly known as:  PRILOSEC  Take 20 mg by mouth  daily as needed (acid reflux).     PARoxetine 40 MG tablet  Commonly known as:   PAXIL  Take 40 mg by mouth every morning.     vitamin B-12 1000 MCG tablet  Commonly known as:  CYANOCOBALAMIN  Take 1,000 mcg by mouth daily.     Vitamin D3 5000 units Tabs  Take 5,000-10,000 Units by mouth 2 (two) times daily. 1 tablet in the morning, 2 tablets in the evening     zolpidem 5 MG tablet  Commonly known as:  AMBIEN  Take 5 mg by mouth at bedtime as needed for sleep.        FOLLOW UP VISIT:       Follow-up Information    Follow up with CAFFREY JR,W D, MD. Schedule an appointment as soon as possible for a visit in 2 weeks.   Specialty:  Orthopedic Surgery   Contact information:   [place] [address] [phone]       DISPOSITION:   Skilled Nursing Facility/Rehab  CONDITION:  Stable   Chriss Czar, PA-C  07/07/2015 8:41 AM

## 2015-07-07 NOTE — Progress Notes (Signed)
Physical Therapy Treatment Patient Details Name: Cristina Hansen MRN: [id number] DOB: 10/19/1955 Today's Date: 07/07/2015    History of Present Illness Patient is a 60 yo female admitted 07/04/15 now s/p Rt TKA.    PMH: OA, depression, obesity    PT Comments    Patient making slow progress toward mobility goals. Current plan remains appropriate.  Follow Up Recommendations  SNF;Supervision/Assistance - 24 hour     Equipment Recommendations  Rolling walker with 5" wheels    Recommendations for Other Services       Precautions / Restrictions Precautions Precautions: Knee Precaution Booklet Issued: Yes (comment) Required Braces or Orthoses: Knee Immobilizer - Right Knee Immobilizer - Right: On when out of bed or walking Restrictions Weight Bearing Restrictions: Yes RLE Weight Bearing: Weight bearing as tolerated    Mobility  Bed Mobility Overal bed mobility: Needs Assistance Bed Mobility: Supine to Sit     Supine to sit: HOB elevated;Min assist     General bed mobility comments: min A to elevate trunk into sitting; HOB elevated with min use of bed rails and vc for sequencing  Transfers Overall transfer level: Needs assistance Equipment used: Rolling walker (2 wheeled) Transfers: Sit to/from Stand Sit to Stand: +2 physical assistance;Min assist         General transfer comment: vc for hand placement and technique with min A +2 for power up into standing; increased time to achieve erect posture  Ambulation/Gait Ambulation/Gait assistance: Min guard Ambulation Distance (Feet): 18 Feet Assistive device: Rolling walker (2 wheeled) Gait Pattern/deviations: Step-to pattern;Decreased step length - left;Decreased stance time - right;Antalgic;Trunk flexed     General Gait Details: vc for sequencing of gait pattern with of AD, posture, and encouragement to increase WB on R LE; pt fatigued quickly with c/o R shoulder pain   Stairs            Wheelchair  Mobility    Modified Rankin (Stroke Patients Only)       Balance Overall balance assessment: Needs assistance Sitting-balance support: Feet supported Sitting balance-Leahy Scale: Good     Standing balance support: Bilateral upper extremity supported Standing balance-Leahy Scale: Fair                      Cognition Arousal/Alertness: Awake/alert Behavior During Therapy: WFL for tasks assessed/performed Overall Cognitive Status: Within Functional Limits for tasks assessed                      Exercises Total Joint Exercises Quad Sets: AROM;Right;10 reps;Seated Heel Slides: AAROM;Right;10 reps;Seated Goniometric ROM: 5-55    General Comments        Pertinent Vitals/Pain Pain Assessment: 0-10 Pain Score: 5  Pain Location: R shoulder and R knee Pain Descriptors / Indicators: Aching;Sore Pain Intervention(s): Limited activity within patient's tolerance;Monitored during session;Repositioned;Patient requesting pain meds-RN notified;Ice applied    Home Living                      Prior Function            PT Goals (current goals can now be found in the care plan section) Acute Rehab PT Goals Patient Stated Goal: To decrease pain PT Goal Formulation: With patient Time For Goal Achievement: 07/11/15 Potential to Achieve Goals: Good Progress towards PT goals: Progressing toward goals    Frequency  7X/week    PT Plan Current plan remains appropriate    Co-evaluation  End of Session Equipment Utilized During Treatment: Gait belt;Right knee immobilizer Activity Tolerance: Patient tolerated treatment well Patient left: in chair;with call bell/phone within reach;with nursing/sitter in room     Time: [id number] PT Time Calculation (min) (ACUTE ONLY): 27 min  Charges:  $Gait Training: 8-22 mins $Therapeutic Exercise: 8-22 mins                    G Codes:      Salina April, PTA Pager: [phone]   07/07/2015, 10:54 AM

## 2015-07-07 NOTE — Progress Notes (Signed)
Orthopedic Tech Progress Note Patient Details:  Cristina Hansen 1956/02/23 [id number]  Ortho Devices Ortho Device/Splint Location: foot roll Ortho Device/Splint Interventions: Veleta Miners 07/07/2015, 4:40 PM

## 2015-07-07 NOTE — Progress Notes (Signed)
Orthopedic Tech Progress Note Patient Details:  Cristina Hansen 1955-08-30 [id number]      [name] 07/07/2015, 9:03 AMOut of zero knee.

## 2015-07-07 NOTE — Care Management Important Message (Signed)
Important Message  Patient Details  Name: Cristina Hansen MRN: [id number] Date of Birth: 05/31/1956   Medicare Important Message Given:  Yes    Nathen May 07/07/2015, 4:24 PM

## 2015-07-07 NOTE — Progress Notes (Signed)
Cristina Hansen discharged to [facility] per MD order. All questions and concerns answered. Copy of scripts sent with patient. IV removed. Report given to Angie.  Patient escorted by family to SNF. No distress noted upon discharge.   Tarri Abernethy R 07/07/2015 4:04 PM

## 2015-07-07 NOTE — Discharge Instructions (Signed)
INSTRUCTIONS AFTER JOINT REPLACEMENT  ° °o Remove items at home which could result in a fall. This includes throw rugs or furniture in walking pathways °o ICE to the affected joint every three hours while awake for 30 minutes at a time, for at least the first 3-5 days, and then as needed for pain and swelling.  Continue to use ice for pain and swelling. You may notice swelling that will progress down to the foot and ankle.  This is normal after surgery.  Elevate your leg when you are not up walking on it.   °o Continue to use the breathing machine you got in the hospital (incentive spirometer) which will help keep your temperature down.  It is common for your temperature to cycle up and down following surgery, especially at night when you are not up moving around and exerting yourself.  The breathing machine keeps your lungs expanded and your temperature down. ° ° °DIET:  As you were doing prior to hospitalization, we recommend a well-balanced diet. ° °DRESSING / WOUND CARE / SHOWERING ° °You may change your dressing 3-5 days after surgery.  Then change the dressing every day with sterile gauze.  Please use good hand washing techniques before changing the dressing.  Do not use any lotions or creams on the incision until instructed by your surgeon. ° °ACTIVITY ° °o Increase activity slowly as tolerated, but follow the weight bearing instructions below.   °o No driving for 6 weeks or until further direction given by your physician.  You cannot drive while taking narcotics.  °o No lifting or carrying greater than 10 lbs. until further directed by your surgeon. °o Avoid periods of inactivity such as sitting longer than an hour when not asleep. This helps prevent blood clots.  °o You may return to work once you are authorized by your doctor.  ° ° ° °WEIGHT BEARING  ° °Weight bearing as tolerated with assist device (walker, cane, etc) as directed, use it as long as suggested by your surgeon or therapist, typically at  least 4-6 weeks. ° ° °EXERCISES ° °Results after joint replacement surgery are often greatly improved when you follow the exercise, range of motion and muscle strengthening exercises prescribed by your doctor. Safety measures are also important to protect the joint from further injury. Any time any of these exercises cause you to have increased pain or swelling, decrease what you are doing until you are comfortable again and then slowly increase them. If you have problems or questions, call your caregiver or physical therapist for advice.  ° °Rehabilitation is important following a joint replacement. After just a few days of immobilization, the muscles of the leg can become weakened and shrink (atrophy).  These exercises are designed to build up the tone and strength of the thigh and leg muscles and to improve motion. Often times heat used for twenty to thirty minutes before working out will loosen up your tissues and help with improving the range of motion but do not use heat for the first two weeks following surgery (sometimes heat can increase post-operative swelling).  ° °These exercises can be done on a training (exercise) mat, on the floor, on a table or on a bed. Use whatever works the best and is most comfortable for you.    Use music or television while you are exercising so that the exercises are a pleasant break in your day. This will make your life better with the exercises acting as a break   in your routine that you can look forward to.   Perform all exercises about fifteen times, three times per day or as directed.  You should exercise both the operative leg and the other leg as well. ° °Exercises include: °  °• Quad Sets - Tighten up the muscle on the front of the thigh (Quad) and hold for 5-10 seconds.   °• Straight Leg Raises - With your knee straight (if you were given a brace, keep it on), lift the leg to 60 degrees, hold for 3 seconds, and slowly lower the leg.  Perform this exercise against  resistance later as your leg gets stronger.  °• Leg Slides: Lying on your back, slowly slide your foot toward your buttocks, bending your knee up off the floor (only go as far as is comfortable). Then slowly slide your foot back down until your leg is flat on the floor again.  °• Angel Wings: Lying on your back spread your legs to the side as far apart as you can without causing discomfort.  °• Hamstring Strength:  Lying on your back, push your heel against the floor with your leg straight by tightening up the muscles of your buttocks.  Repeat, but this time bend your knee to a comfortable angle, and push your heel against the floor.  You may put a pillow under the heel to make it more comfortable if necessary.  ° °A rehabilitation program following joint replacement surgery can speed recovery and prevent re-injury in the future due to weakened muscles. Contact your doctor or a physical therapist for more information on knee rehabilitation.  ° ° °CONSTIPATION ° °Constipation is defined medically as fewer than three stools per week and severe constipation as less than one stool per week.  Even if you have a regular bowel pattern at home, your normal regimen is likely to be disrupted due to multiple reasons following surgery.  Combination of anesthesia, postoperative narcotics, change in appetite and fluid intake all can affect your bowels.  ° °YOU MUST use at least one of the following options; they are listed in order of increasing strength to get the job done.  They are all available over the counter, and you may need to use some, POSSIBLY even all of these options:   ° °Drink plenty of fluids (prune juice may be helpful) and high fiber foods °Colace 100 mg by mouth twice a day  °Senokot for constipation as directed and as needed Dulcolax (bisacodyl), take with full glass of water  °Miralax (polyethylene glycol) once or twice a day as needed. ° °If you have tried all these things and are unable to have a bowel  movement in the first 3-4 days after surgery call either your surgeon or your primary doctor.   ° °If you experience loose stools or diarrhea, hold the medications until you stool forms back up.  If your symptoms do not get better within 1 week or if they get worse, check with your doctor.  If you experience "the worst abdominal pain ever" or develop nausea or vomiting, please contact the office immediately for further recommendations for treatment. ° ° °ITCHING:  If you experience itching with your medications, try taking only a single pain pill, or even half a pain pill at a time.  You can also use Benadryl over the counter for itching or also to help with sleep.  ° °TED HOSE STOCKINGS:  Use stockings on both legs until for at least 2 weeks or as   directed by physician office. They may be removed at night for sleeping.  MEDICATIONS:  See your medication summary on the After Visit Summary that nursing will review with you.  You may have some home medications which will be placed on hold until you complete the course of blood thinner medication.  It is important for you to complete the blood thinner medication as prescribed.  PRECAUTIONS:  If you experience chest pain or shortness of breath - call 911 immediately for transfer to the hospital emergency department.   If you develop a fever greater that 101 F, purulent drainage from wound, increased redness or drainage from wound, foul odor from the wound/dressing, or calf pain - CONTACT YOUR SURGEON.                                                   FOLLOW-UP APPOINTMENTS:  If you do not already have a post-op appointment, please call the office for an appointment to be seen by your surgeon.  Guidelines for how soon to be seen are listed in your After Visit Summary, but are typically between 1-4 weeks after surgery.  OTHER INSTRUCTIONS:   Knee Replacement:  Do not place pillow under knee, focus on keeping the knee straight while resting. CPM  instructions: 0-90 degrees, 2 hours in the morning, 2 hours in the afternoon, and 2 hours in the evening. Place foam block, curve side up under heel at all times except when in CPM or when walking.  DO NOT modify, tear, cut, or change the foam block in any way.  MAKE SURE YOU:   Understand these instructions.   Get help right away if you are not doing well or get worse.    Thank you for letting us be a part of your medical care team.  It is a privilege we respect greatly.  We hope these instructions will help you stay on track for a fast and full recovery!   STOP DATE ON [facility] IS 07/19/15

## 2015-07-07 NOTE — Progress Notes (Signed)
CSW provided list of bed offers for SNF facilities.   Pt/Family prefers:  [facility]   CSW will contact the facility for d/c planning.     [facility]  [phone]

## 2015-08-14 DIAGNOSIS — Z6841 Body Mass Index (BMI) 40.0 and over, adult: Secondary | ICD-10-CM | POA: Insufficient documentation

## 2015-08-14 DIAGNOSIS — M11261 Other chondrocalcinosis, right knee: Secondary | ICD-10-CM | POA: Insufficient documentation

## 2015-08-15 ENCOUNTER — Other Ambulatory Visit: Payer: Self-pay | Admitting: Family Medicine

## 2015-08-15 DIAGNOSIS — N63 Unspecified lump in unspecified breast: Secondary | ICD-10-CM

## 2015-09-03 ENCOUNTER — Other Ambulatory Visit: Payer: Self-pay | Admitting: Family Medicine

## 2015-09-03 DIAGNOSIS — N63 Unspecified lump in unspecified breast: Secondary | ICD-10-CM

## 2015-09-25 ENCOUNTER — Ambulatory Visit
Admission: RE | Admit: 2015-09-25 | Discharge: 2015-09-25 | Disposition: A | Discharge status: Home / Self Care | Payer: Medicare HMO | Source: Ambulatory Visit | Attending: Family Medicine | Admitting: Family Medicine

## 2015-09-25 DIAGNOSIS — N63 Unspecified lump in unspecified breast: Secondary | ICD-10-CM

## 2016-09-29 ENCOUNTER — Other Ambulatory Visit: Payer: Self-pay | Admitting: Family Medicine

## 2016-09-29 DIAGNOSIS — Z1231 Encounter for screening mammogram for malignant neoplasm of breast: Secondary | ICD-10-CM

## 2016-11-15 ENCOUNTER — Ambulatory Visit
Admission: RE | Admit: 2016-11-15 | Discharge: 2016-11-15 | Disposition: A | Discharge status: Home / Self Care | Payer: Medicare HMO | Source: Ambulatory Visit | Attending: Family Medicine | Admitting: Family Medicine

## 2016-11-15 DIAGNOSIS — Z1231 Encounter for screening mammogram for malignant neoplasm of breast: Secondary | ICD-10-CM

## 2017-04-20 IMAGING — CR DG CHEST 2V
2 series · 2 of 2 positions shown · non-contrast
Comparison: 11/14/2003

CLINICAL DATA: Preop right knee replacement

EXAM:
CHEST  2 VIEW

[w chest pa]
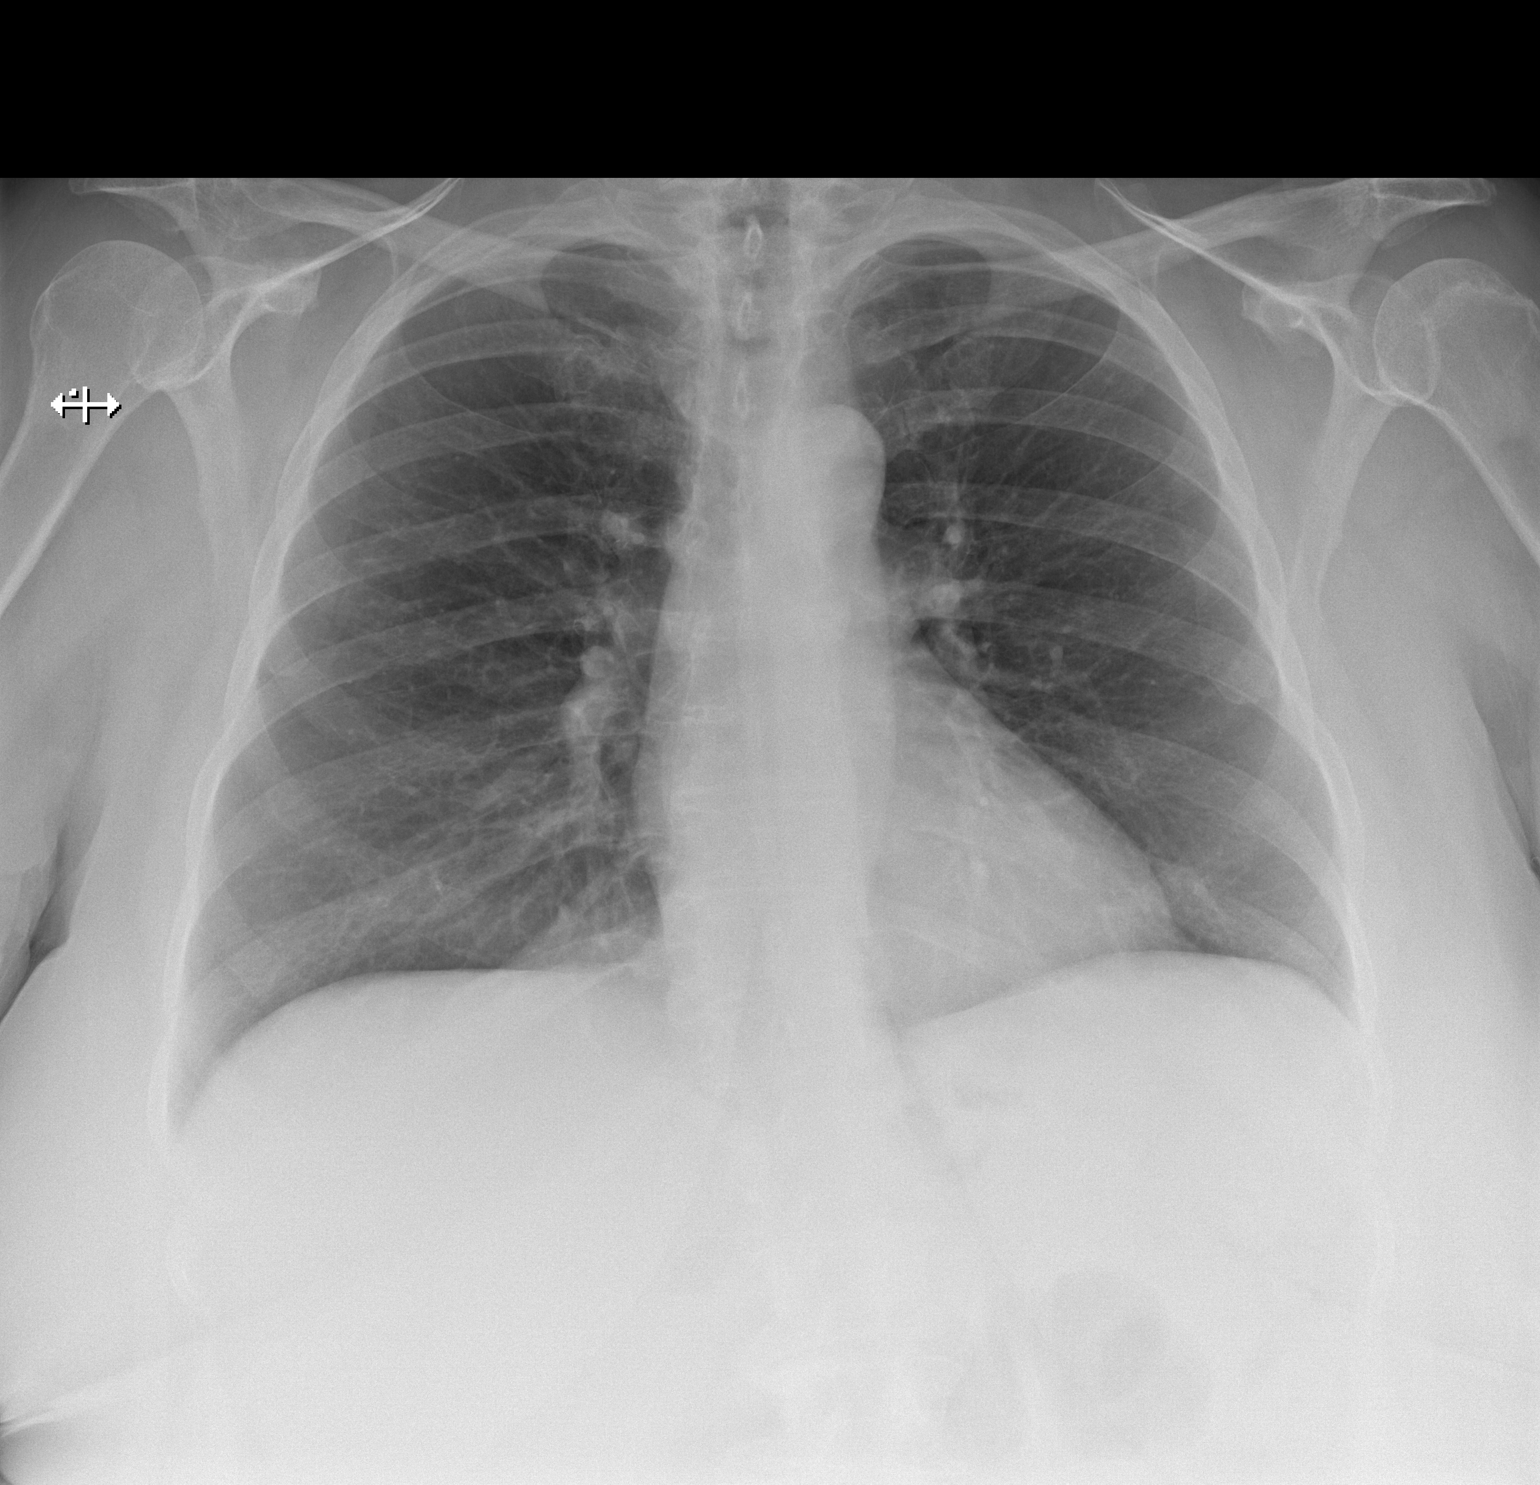

[w chest lat]
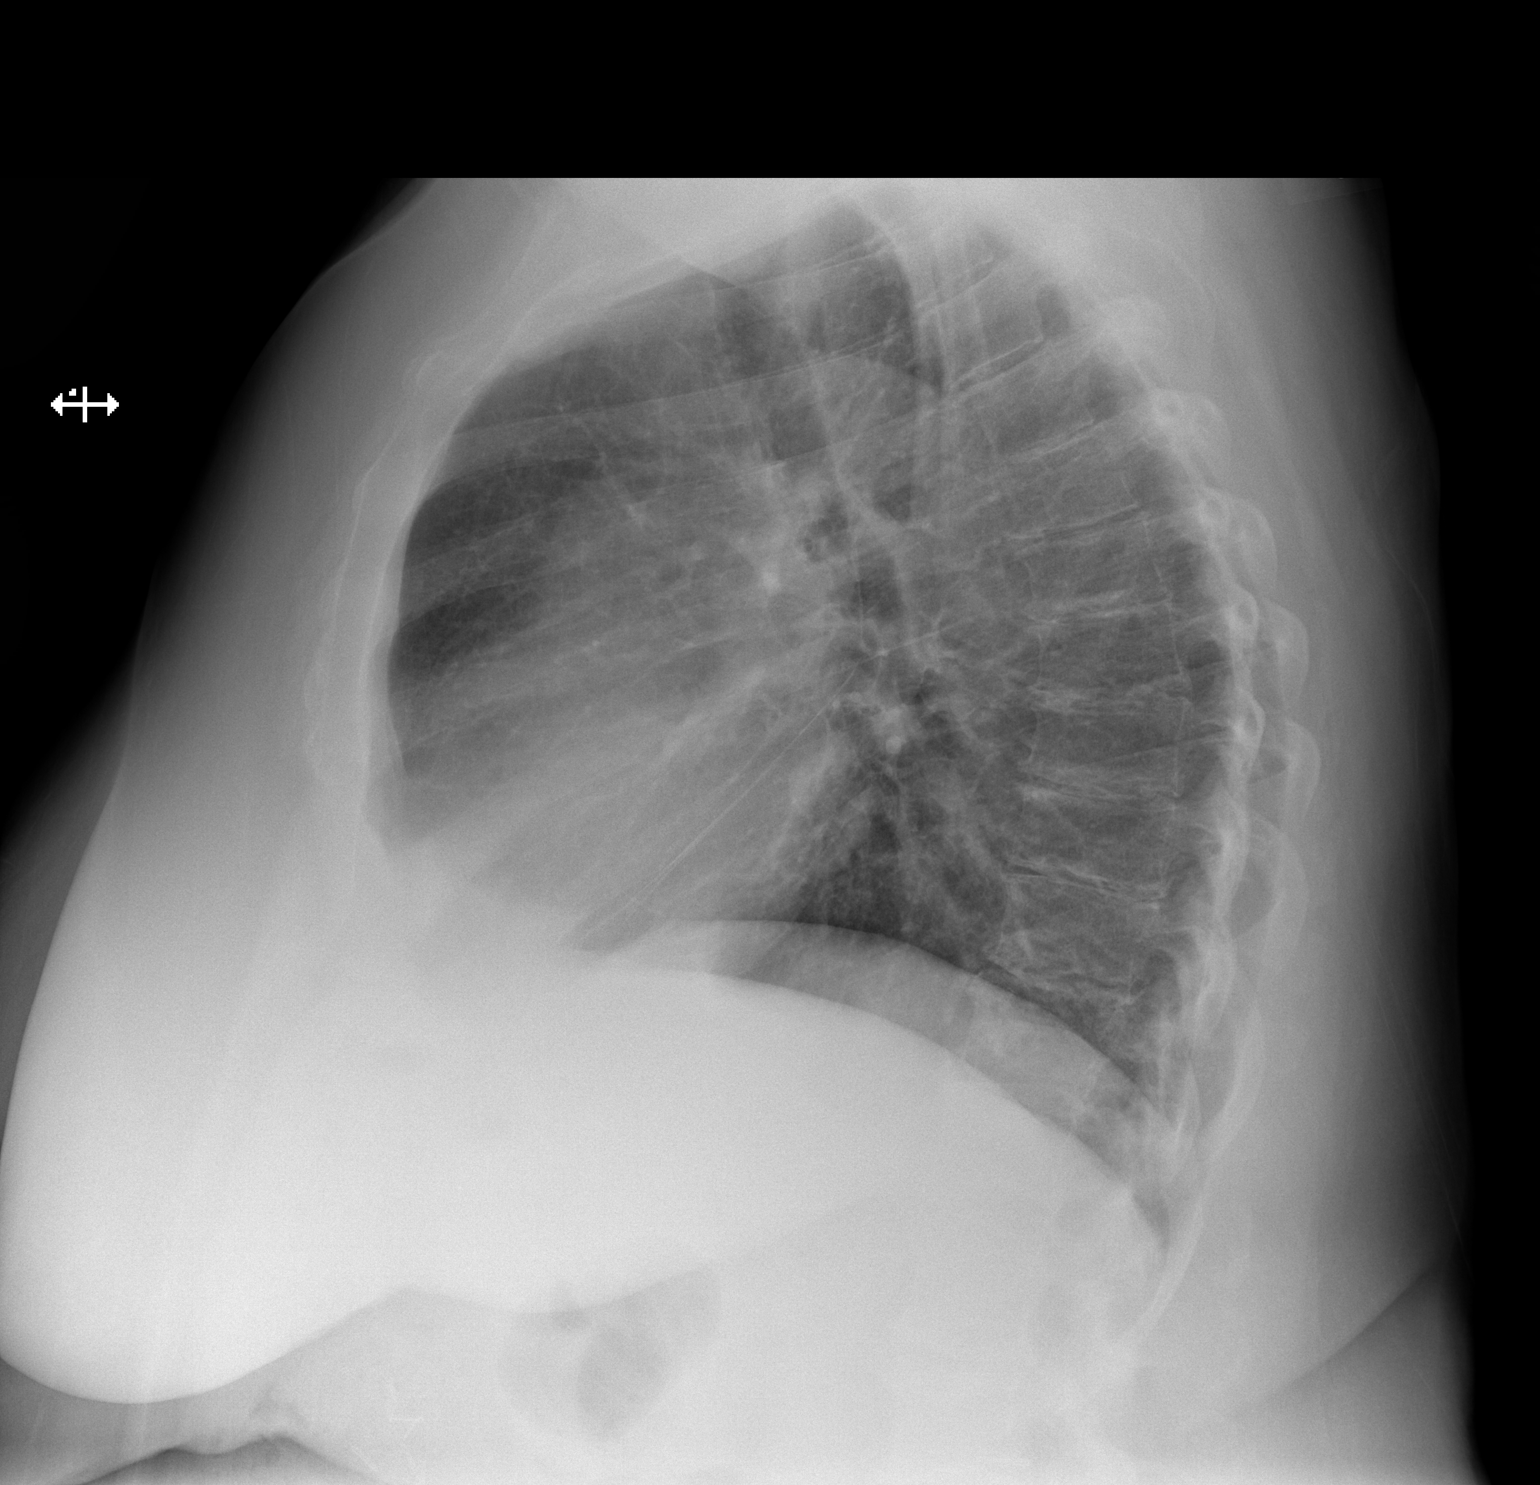

[2 of 2 positions shown; findings below may reference images not displayed]

FINDINGS: The heart size and mediastinal contours are within normal limits.
Both lungs are clear. The visualized skeletal structures are
unremarkable.
IMPRESSION: No active cardiopulmonary disease.

## 2017-10-27 ENCOUNTER — Encounter: Payer: Self-pay | Admitting: Gastroenterology

## 2018-02-03 ENCOUNTER — Other Ambulatory Visit: Payer: Self-pay | Admitting: Family Medicine

## 2018-02-03 DIAGNOSIS — Z1231 Encounter for screening mammogram for malignant neoplasm of breast: Secondary | ICD-10-CM

## 2018-03-02 ENCOUNTER — Ambulatory Visit
Admission: RE | Admit: 2018-03-02 | Discharge: 2018-03-02 | Disposition: A | Discharge status: Home / Self Care | Payer: Medicare HMO | Source: Ambulatory Visit | Attending: Family Medicine | Admitting: Family Medicine

## 2018-03-02 DIAGNOSIS — Z1231 Encounter for screening mammogram for malignant neoplasm of breast: Secondary | ICD-10-CM

## 2018-11-14 ENCOUNTER — Other Ambulatory Visit: Payer: Self-pay | Admitting: Orthopedic Surgery

## 2018-11-14 DIAGNOSIS — M545 Low back pain, unspecified: Secondary | ICD-10-CM

## 2018-12-02 ENCOUNTER — Ambulatory Visit
Admission: RE | Admit: 2018-12-02 | Discharge: 2018-12-02 | Disposition: A | Discharge status: Home / Self Care | Payer: Medicare HMO | Source: Ambulatory Visit | Attending: Orthopedic Surgery | Admitting: Orthopedic Surgery

## 2018-12-02 ENCOUNTER — Other Ambulatory Visit: Payer: Self-pay

## 2018-12-02 DIAGNOSIS — M545 Low back pain, unspecified: Secondary | ICD-10-CM

## 2019-03-01 ENCOUNTER — Other Ambulatory Visit: Payer: Self-pay | Admitting: Family Medicine

## 2019-03-01 DIAGNOSIS — Z1231 Encounter for screening mammogram for malignant neoplasm of breast: Secondary | ICD-10-CM

## 2019-04-16 ENCOUNTER — Ambulatory Visit
Admission: RE | Admit: 2019-04-16 | Discharge: 2019-04-16 | Disposition: A | Discharge status: Home / Self Care | Payer: Medicare HMO | Source: Ambulatory Visit | Attending: Family Medicine | Admitting: Family Medicine

## 2019-04-16 ENCOUNTER — Other Ambulatory Visit: Payer: Self-pay

## 2019-04-16 DIAGNOSIS — Z1231 Encounter for screening mammogram for malignant neoplasm of breast: Secondary | ICD-10-CM

## 2019-05-03 ENCOUNTER — Encounter: Payer: Self-pay | Admitting: Gastroenterology

## 2019-05-30 ENCOUNTER — Encounter: Payer: Self-pay | Admitting: Gastroenterology

## 2019-05-30 ENCOUNTER — Other Ambulatory Visit: Payer: Self-pay

## 2019-05-30 ENCOUNTER — Ambulatory Visit (AMBULATORY_SURGERY_CENTER): Payer: Medicare HMO | Admitting: *Deleted

## 2019-05-30 VITALS — Temp 96.9°F | Ht 60.5 in | Wt 245.0 lb

## 2019-05-30 DIAGNOSIS — Z1159 Encounter for screening for other viral diseases: Secondary | ICD-10-CM

## 2019-05-30 DIAGNOSIS — Z1211 Encounter for screening for malignant neoplasm of colon: Secondary | ICD-10-CM

## 2019-05-30 MED ORDER — SUPREP BOWEL PREP KIT 17.5-3.13-1.6 GM/177ML PO SOLN: 1.0000 | Freq: Once | ORAL | 0 refills | Status: AC

## 2019-05-30 NOTE — Progress Notes (Signed)

## 2019-06-07 ENCOUNTER — Other Ambulatory Visit (HOSPITAL_COMMUNITY): Admission: RE | Admit: 2019-06-07 | Payer: Medicare HMO | Source: Ambulatory Visit

## 2019-06-11 ENCOUNTER — Other Ambulatory Visit (HOSPITAL_COMMUNITY)
Admission: RE | Admit: 2019-06-11 | Discharge: 2019-06-11 | Disposition: A | Discharge status: Home / Self Care | Payer: Medicare HMO | Source: Ambulatory Visit | Attending: Gastroenterology | Admitting: Gastroenterology

## 2019-06-11 ENCOUNTER — Other Ambulatory Visit: Payer: Self-pay

## 2019-06-11 DIAGNOSIS — Z01812 Encounter for preprocedural laboratory examination: Secondary | ICD-10-CM | POA: Insufficient documentation

## 2019-06-11 DIAGNOSIS — Z20828 Contact with and (suspected) exposure to other viral communicable diseases: Secondary | ICD-10-CM | POA: Diagnosis not present

## 2019-06-11 LAB — SARS CORONAVIRUS 2 (TAT 6-24 HRS): SARS Coronavirus 2: NEGATIVE

## 2019-06-13 ENCOUNTER — Encounter: Payer: Self-pay | Admitting: Gastroenterology

## 2019-06-13 ENCOUNTER — Ambulatory Visit (AMBULATORY_SURGERY_CENTER): Payer: Medicare HMO | Admitting: Gastroenterology

## 2019-06-13 ENCOUNTER — Other Ambulatory Visit: Payer: Self-pay

## 2019-06-13 VITALS — BP 152/63 | HR 80 | Temp 98.1°F | Resp 18 | Ht 60.0 in | Wt 245.0 lb

## 2019-06-13 DIAGNOSIS — D122 Benign neoplasm of ascending colon: Secondary | ICD-10-CM

## 2019-06-13 DIAGNOSIS — D128 Benign neoplasm of rectum: Secondary | ICD-10-CM

## 2019-06-13 DIAGNOSIS — K5520 Angiodysplasia of colon without hemorrhage: Secondary | ICD-10-CM | POA: Diagnosis not present

## 2019-06-13 DIAGNOSIS — Z1211 Encounter for screening for malignant neoplasm of colon: Secondary | ICD-10-CM | POA: Diagnosis present

## 2019-06-13 DIAGNOSIS — D124 Benign neoplasm of descending colon: Secondary | ICD-10-CM

## 2019-06-13 MED ORDER — SODIUM CHLORIDE 0.9 % IV SOLN: 500.0000 mL | INTRAVENOUS | Status: DC

## 2019-06-13 NOTE — Progress Notes (Signed)
temp jb Vs CW I have reviewed the patient's medical history in detail and updated the computerized patient record.

## 2019-06-13 NOTE — Progress Notes (Signed)
Report to PACU, RN, vss, BBS= Clear.  

## 2019-06-13 NOTE — Op Note (Signed)
[facility] Patient Name: Cristina Hansen Procedure Date: 06/13/2019 2:59 PM MRN: [id number] Endoscopist: Remo Lipps P. Havery Moros , MD Age: 63 Referring MD:  Date of Birth: Apr 12, 1956 Gender: Female Account #: [id number] Procedure:                Colonoscopy Indications:              Screening for colorectal malignant neoplasm Medicines:                Monitored Anesthesia Care Procedure:                Pre-Anesthesia Assessment:                           - Prior to the procedure, a History and Physical                            was performed, and patient medications and                            allergies were reviewed. The patient's tolerance of                            previous anesthesia was also reviewed. The risks                            and benefits of the procedure and the sedation                            options and risks were discussed with the patient.                            All questions were answered, and informed consent                            was obtained. Prior Anticoagulants: The patient has                            taken no previous anticoagulant or antiplatelet                            agents. ASA Grade Assessment: III - A patient with                            severe systemic disease. After reviewing the risks                            and benefits, the patient was deemed in                            satisfactory condition to undergo the procedure.                           After obtaining informed consent, the colonoscope  was passed under direct vision. Throughout the                            procedure, the patient's blood pressure, pulse, and                            oxygen saturations were monitored continuously. The                            Colonoscope was introduced through the anus and                            advanced to the the cecum, identified by                            appendiceal  orifice and ileocecal valve. The                            colonoscopy was technically difficult and complex                            due to a tortuous colon. The patient tolerated the                            procedure well. The quality of the bowel                            preparation was adequate. The ileocecal valve,                            appendiceal orifice, and rectum were photographed. Scope In: 3:09:09 PM Scope Out: 3:54:36 PM Scope Withdrawal Time: 0 hours 29 minutes 5 seconds  Total Procedure Duration: 0 hours 45 minutes 27 seconds  Findings:                 The perianal and digital rectal examinations were                            normal.                           A single small angiodysplastic lesion was found in                            the cecum.                           Two sessile polyps were found in the ascending                            colon. The polyps were 6 to 10 mm in size. These                            polyps were removed with a cold snare. Resection  and retrieval were complete.                           A 5 mm polyp was found in the descending colon. The                            polyp was sessile. The polyp was removed with a                            cold snare. Resection and retrieval were complete.                           A 4 mm polyp was found in the distal rectum. The                            polyp was sessile. The polyp was removed with a                            cold snare. Resection and retrieval were complete.                           A few small-mouthed diverticula were found in the                            sigmoid colon.                           Anal papilla(e) were hypertrophied.                           Internal hemorrhoids were found during retroflexion.                           The colon was tortuous with looping, with prolonged                            the procedure.                            The exam was otherwise without abnormality. Complications:            No immediate complications. Estimated blood loss:                            Minimal. Estimated Blood Loss:     Estimated blood loss was minimal. Impression:               - A single colonic angiodysplastic lesion.                           - Two 6 to 10 mm polyps in the ascending colon,                            removed with a cold snare. Resected and retrieved.                           -  One 5 mm polyp in the descending colon, removed                            with a cold snare. Resected and retrieved.                           - One 4 mm polyp in the distal rectum, removed with                            a cold snare. Resected and retrieved.                           - Diverticulosis in the sigmoid colon.                           - Anal papilla(e) were hypertrophied.                           - Internal hemorrhoids.                           - Tortuous colon.                           - The examination was otherwise normal. Recommendation:           - Patient has a contact number available for                            emergencies. The signs and symptoms of potential                            delayed complications were discussed with the                            patient. Return to normal activities tomorrow.                            Written discharge instructions were provided to the                            patient.                           - Resume previous diet.                           - Continue present medications.                           - Await pathology results. Remo Lipps P. Aadil Sur, MD 06/13/2019 4:01:32 PM This report has been signed electronically.

## 2019-06-13 NOTE — Patient Instructions (Signed)
Handouts given on polyps, diverticulosis, and hemorrhoids.  YOU HAD AN ENDOSCOPIC PROCEDURE TODAY AT THE [facility]:   Refer to the procedure report that was given to you for any specific questions about what was found during the examination.  If the procedure report does not answer your questions, please call your gastroenterologist to clarify.  If you requested that your care partner not be given the details of your procedure findings, then the procedure report has been included in a sealed envelope for you to review at your convenience later.  YOU SHOULD EXPECT: Some feelings of bloating in the abdomen. Passage of more gas than usual.  Walking can help get rid of the air that was put into your GI tract during the procedure and reduce the bloating. If you had a lower endoscopy (such as a colonoscopy or flexible sigmoidoscopy) you may notice spotting of blood in your stool or on the toilet paper. If you underwent a bowel prep for your procedure, you may not have a normal bowel movement for a few days.  Please Note:  You might notice some irritation and congestion in your nose or some drainage.  This is from the oxygen used during your procedure.  There is no need for concern and it should clear up in a day or so.  SYMPTOMS TO REPORT IMMEDIATELY:   Following lower endoscopy (colonoscopy or flexible sigmoidoscopy):  Excessive amounts of blood in the stool  Significant tenderness or worsening of abdominal pains  Swelling of the abdomen that is new, acute  Fever of 100F or higher  For urgent or emergent issues, a gastroenterologist can be reached at any hour by calling [phone].   DIET:  We do recommend a small meal at first, but then you may proceed to your regular diet.  Drink plenty of fluids but you should avoid alcoholic beverages for 24 hours.  ACTIVITY:  You should plan to take it easy for the rest of today and you should NOT DRIVE or use heavy machinery until tomorrow  (because of the sedation medicines used during the test).    FOLLOW UP: Our staff will call the number listed on your records 48-72 hours following your procedure to check on you and address any questions or concerns that you may have regarding the information given to you following your procedure. If we do not reach you, we will leave a message.  We will attempt to reach you two times.  During this call, we will ask if you have developed any symptoms of COVID 19. If you develop any symptoms (ie: fever, flu-like symptoms, shortness of breath, cough etc.) before then, please call [phone].  If you test positive for Covid 19 in the 2 weeks post procedure, please call and report this information to us.    If any biopsies were taken you will be contacted by phone or by letter within the next 1-3 weeks.  Please call us at [phone] if you have not heard about the biopsies in 3 weeks.    SIGNATURES/CONFIDENTIALITY: You and/or your care partner have signed paperwork which will be entered into your electronic medical record.  These signatures attest to the fact that that the information above on your After Visit Summary has been reviewed and is understood.  Full responsibility of the confidentiality of this discharge information lies with you and/or your care-partner.  

## 2019-06-13 NOTE — Progress Notes (Signed)
Called to room to assist during endoscopic procedure.  Patient ID and intended procedure confirmed with present staff. Received instructions for my participation in the procedure from the performing physician.  

## 2019-06-18 ENCOUNTER — Telehealth: Payer: Self-pay | Admitting: *Deleted

## 2019-06-18 NOTE — Telephone Encounter (Signed)
Pt called back and states she is doing well.

## 2019-06-18 NOTE — Telephone Encounter (Signed)
Second attempt at f/u phone call made. No answer. Left message.

## 2019-06-18 NOTE — Telephone Encounter (Signed)
Attempted f/u phone call. No answer. Left message. °

## 2019-06-19 ENCOUNTER — Encounter: Payer: Self-pay | Admitting: Gastroenterology

## 2019-08-20 ENCOUNTER — Emergency Department (HOSPITAL_COMMUNITY): Payer: Medicare HMO

## 2019-08-20 ENCOUNTER — Other Ambulatory Visit: Payer: Self-pay

## 2019-08-20 ENCOUNTER — Encounter (HOSPITAL_COMMUNITY): Payer: Self-pay

## 2019-08-20 ENCOUNTER — Inpatient Hospital Stay (HOSPITAL_COMMUNITY)
Admission: EM | Admit: 2019-08-20 | Discharge: 2019-08-23 | DRG: 854 | Disposition: A | Discharge status: Home / Self Care | Payer: Medicare HMO | Attending: Internal Medicine | Admitting: Internal Medicine

## 2019-08-20 DIAGNOSIS — A4151 Sepsis due to Escherichia coli [E. coli]: Secondary | ICD-10-CM | POA: Diagnosis present

## 2019-08-20 DIAGNOSIS — E876 Hypokalemia: Secondary | ICD-10-CM | POA: Diagnosis present

## 2019-08-20 DIAGNOSIS — Z8371 Family history of colonic polyps: Secondary | ICD-10-CM

## 2019-08-20 DIAGNOSIS — N390 Urinary tract infection, site not specified: Secondary | ICD-10-CM | POA: Diagnosis not present

## 2019-08-20 DIAGNOSIS — F329 Major depressive disorder, single episode, unspecified: Secondary | ICD-10-CM | POA: Diagnosis present

## 2019-08-20 DIAGNOSIS — G8929 Other chronic pain: Secondary | ICD-10-CM | POA: Diagnosis present

## 2019-08-20 DIAGNOSIS — E871 Hypo-osmolality and hyponatremia: Secondary | ICD-10-CM | POA: Diagnosis present

## 2019-08-20 DIAGNOSIS — B9620 Unspecified Escherichia coli [E. coli] as the cause of diseases classified elsewhere: Secondary | ICD-10-CM | POA: Diagnosis not present

## 2019-08-20 DIAGNOSIS — E1129 Type 2 diabetes mellitus with other diabetic kidney complication: Secondary | ICD-10-CM | POA: Diagnosis present

## 2019-08-20 DIAGNOSIS — Z9884 Bariatric surgery status: Secondary | ICD-10-CM

## 2019-08-20 DIAGNOSIS — Z96653 Presence of artificial knee joint, bilateral: Secondary | ICD-10-CM | POA: Diagnosis present

## 2019-08-20 DIAGNOSIS — J45909 Unspecified asthma, uncomplicated: Secondary | ICD-10-CM | POA: Diagnosis present

## 2019-08-20 DIAGNOSIS — A419 Sepsis, unspecified organism: Secondary | ICD-10-CM | POA: Diagnosis present

## 2019-08-20 DIAGNOSIS — G4733 Obstructive sleep apnea (adult) (pediatric): Secondary | ICD-10-CM | POA: Diagnosis present

## 2019-08-20 DIAGNOSIS — M7918 Myalgia, other site: Secondary | ICD-10-CM | POA: Diagnosis present

## 2019-08-20 DIAGNOSIS — D696 Thrombocytopenia, unspecified: Secondary | ICD-10-CM | POA: Diagnosis present

## 2019-08-20 DIAGNOSIS — K2100 Gastro-esophageal reflux disease with esophagitis, without bleeding: Secondary | ICD-10-CM | POA: Diagnosis not present

## 2019-08-20 DIAGNOSIS — E1122 Type 2 diabetes mellitus with diabetic chronic kidney disease: Secondary | ICD-10-CM | POA: Diagnosis present

## 2019-08-20 DIAGNOSIS — N10 Acute tubulo-interstitial nephritis: Secondary | ICD-10-CM

## 2019-08-20 DIAGNOSIS — N200 Calculus of kidney: Secondary | ICD-10-CM | POA: Diagnosis not present

## 2019-08-20 DIAGNOSIS — R809 Proteinuria, unspecified: Secondary | ICD-10-CM

## 2019-08-20 DIAGNOSIS — E785 Hyperlipidemia, unspecified: Secondary | ICD-10-CM | POA: Diagnosis present

## 2019-08-20 DIAGNOSIS — Z20822 Contact with and (suspected) exposure to covid-19: Secondary | ICD-10-CM | POA: Diagnosis present

## 2019-08-20 DIAGNOSIS — N136 Pyonephrosis: Secondary | ICD-10-CM | POA: Diagnosis present

## 2019-08-20 DIAGNOSIS — R6510 Systemic inflammatory response syndrome (SIRS) of non-infectious origin without acute organ dysfunction: Secondary | ICD-10-CM

## 2019-08-20 DIAGNOSIS — N179 Acute kidney failure, unspecified: Secondary | ICD-10-CM

## 2019-08-20 DIAGNOSIS — I10 Essential (primary) hypertension: Secondary | ICD-10-CM | POA: Diagnosis present

## 2019-08-20 DIAGNOSIS — Z79899 Other long term (current) drug therapy: Secondary | ICD-10-CM

## 2019-08-20 DIAGNOSIS — K8020 Calculus of gallbladder without cholecystitis without obstruction: Secondary | ICD-10-CM | POA: Diagnosis present

## 2019-08-20 DIAGNOSIS — K219 Gastro-esophageal reflux disease without esophagitis: Secondary | ICD-10-CM | POA: Diagnosis present

## 2019-08-20 DIAGNOSIS — G4710 Hypersomnia, unspecified: Secondary | ICD-10-CM | POA: Diagnosis present

## 2019-08-20 DIAGNOSIS — R7881 Bacteremia: Secondary | ICD-10-CM | POA: Diagnosis not present

## 2019-08-20 DIAGNOSIS — K5900 Constipation, unspecified: Secondary | ICD-10-CM | POA: Diagnosis present

## 2019-08-20 DIAGNOSIS — Z6841 Body Mass Index (BMI) 40.0 and over, adult: Secondary | ICD-10-CM

## 2019-08-20 DIAGNOSIS — Z87891 Personal history of nicotine dependence: Secondary | ICD-10-CM

## 2019-08-20 LAB — CBC WITH DIFFERENTIAL/PLATELET
Abs Immature Granulocytes: 0.99 10*3/uL — ABNORMAL HIGH (ref 0.00–0.07)
Basophils Absolute: 0.1 10*3/uL (ref 0.0–0.1)
Basophils Relative: 1 %
Eosinophils Absolute: 0 10*3/uL (ref 0.0–0.5)
Eosinophils Relative: 0 %
HCT: 35.2 % — ABNORMAL LOW (ref 36.0–46.0)
Hemoglobin: 12 g/dL (ref 12.0–15.0)
Immature Granulocytes: 7 %
Lymphocytes Relative: 4 %
Lymphs Abs: 0.5 10*3/uL — ABNORMAL LOW (ref 0.7–4.0)
MCH: 29 pg (ref 26.0–34.0)
MCHC: 34.1 g/dL (ref 30.0–36.0)
MCV: 85 fL (ref 80.0–100.0)
Monocytes Absolute: 0.9 10*3/uL (ref 0.1–1.0)
Monocytes Relative: 6 %
Neutro Abs: 11.4 10*3/uL — ABNORMAL HIGH (ref 1.7–7.7)
Neutrophils Relative %: 82 %
Platelets: 29 10*3/uL — CL (ref 150–400)
RBC: 4.14 MIL/uL (ref 3.87–5.11)
RDW: 13.3 % (ref 11.5–15.5)
WBC: 13.9 10*3/uL — ABNORMAL HIGH (ref 4.0–10.5)
nRBC: 0 % (ref 0.0–0.2)

## 2019-08-20 LAB — ETHANOL: Alcohol, Ethyl (B): <10 mg/dL (ref <10)

## 2019-08-20 LAB — RAPID URINE DRUG SCREEN, HOSP PERFORMED
Amphetamines: NOT DETECTED
Barbiturates: NOT DETECTED
Benzodiazepines: NOT DETECTED
Cocaine: NOT DETECTED
Opiates: NOT DETECTED
Tetrahydrocannabinol: NOT DETECTED

## 2019-08-20 LAB — URINALYSIS, ROUTINE W REFLEX MICROSCOPIC
Bilirubin Urine: NEGATIVE
Glucose, UA: NEGATIVE mg/dL
Ketones, ur: NEGATIVE mg/dL
Nitrite: NEGATIVE
Protein, ur: 30 mg/dL — AB
Specific Gravity, Urine: 1.012 (ref 1.005–1.030)
WBC, UA: >50 WBC/hpf — ABNORMAL HIGH (ref 0–5)
pH: 6 (ref 5.0–8.0)

## 2019-08-20 LAB — COMPREHENSIVE METABOLIC PANEL
ALT: 27 U/L (ref 0–44)
AST: 41 U/L (ref 15–41)
Albumin: 2.6 g/dL — ABNORMAL LOW (ref 3.5–5.0)
Alkaline Phosphatase: 273 U/L — ABNORMAL HIGH (ref 38–126)
Anion gap: 11 (ref 5–15)
BUN: 33 mg/dL — ABNORMAL HIGH (ref 8–23)
CO2: 20 mmol/L — ABNORMAL LOW (ref 22–32)
Calcium: 8.2 mg/dL — ABNORMAL LOW (ref 8.9–10.3)
Chloride: 94 mmol/L — ABNORMAL LOW (ref 98–111)
Creatinine, Ser: 1.36 mg/dL — ABNORMAL HIGH (ref 0.44–1.00)
GFR calc Af Amer: 47 mL/min — ABNORMAL LOW (ref >60)
GFR calc non Af Amer: 41 mL/min — ABNORMAL LOW (ref >60)
Glucose, Bld: 155 mg/dL — ABNORMAL HIGH (ref 70–99)
Potassium: 3.2 mmol/L — ABNORMAL LOW (ref 3.5–5.1)
Sodium: 125 mmol/L — ABNORMAL LOW (ref 135–145)
Total Bilirubin: 2.2 mg/dL — ABNORMAL HIGH (ref 0.3–1.2)
Total Protein: 6.1 g/dL — ABNORMAL LOW (ref 6.5–8.1)

## 2019-08-20 LAB — LACTIC ACID, PLASMA: Lactic Acid, Venous: 1.6 mmol/L (ref 0.5–1.9)

## 2019-08-20 LAB — POC SARS CORONAVIRUS 2 AG -  ED: SARS Coronavirus 2 Ag: NEGATIVE

## 2019-08-20 MED ORDER — DULOXETINE HCL 60 MG PO CPEP
60.0000 mg | ORAL_CAPSULE | Freq: Two times a day (BID) | ORAL | Status: DC
  Administered 2019-08-20 – 2019-08-23 (×6): 60 mg via ORAL
  Filled 2019-08-20 (×3): qty 2
  Filled 2019-08-20: qty 1
  Filled 2019-08-20: qty 2
  Filled 2019-08-20: qty 1

## 2019-08-20 MED ORDER — SODIUM CHLORIDE 0.9 % IV SOLN: INTRAVENOUS | Status: DC

## 2019-08-20 MED ORDER — VANCOMYCIN HCL IN DEXTROSE 1-5 GM/200ML-% IV SOLN
1000.0000 mg | Freq: Once | INTRAVENOUS | Status: AC
  Administered 2019-08-20: 1000 mg via INTRAVENOUS
  Filled 2019-08-20: qty 200

## 2019-08-20 MED ORDER — INSULIN ASPART 100 UNIT/ML ~~LOC~~ SOLN
0.0000 [IU] | Freq: Three times a day (TID) | SUBCUTANEOUS | Status: DC
  Administered 2019-08-21: 1 [IU] via SUBCUTANEOUS

## 2019-08-20 MED ORDER — ACETAMINOPHEN 325 MG PO TABS
650.0000 mg | ORAL_TABLET | Freq: Once | ORAL | Status: AC
  Administered 2019-08-20: 650 mg via ORAL
  Filled 2019-08-20: qty 2

## 2019-08-20 MED ORDER — SODIUM CHLORIDE 0.9 % IV SOLN
2.0000 g | INTRAVENOUS | Status: DC
  Administered 2019-08-20 – 2019-08-22 (×3): 2 g via INTRAVENOUS
  Filled 2019-08-20 (×3): qty 20
  Filled 2019-08-20: qty 2
  Filled 2019-08-20: qty 20

## 2019-08-20 MED ORDER — HEPARIN SODIUM (PORCINE) 5000 UNIT/ML IJ SOLN
5000.0000 [IU] | Freq: Three times a day (TID) | INTRAMUSCULAR | Status: DC
  Administered 2019-08-20 – 2019-08-21 (×3): 5000 [IU] via SUBCUTANEOUS
  Filled 2019-08-20 (×3): qty 1

## 2019-08-20 MED ORDER — SODIUM CHLORIDE 0.9 % IV BOLUS
1000.0000 mL | Freq: Once | INTRAVENOUS | Status: AC
  Administered 2019-08-20: 1000 mL via INTRAVENOUS

## 2019-08-20 MED ORDER — MODAFINIL 200 MG PO TABS
300.0000 mg | ORAL_TABLET | Freq: Every day | ORAL | Status: DC
  Administered 2019-08-21 – 2019-08-22 (×2): 300 mg via ORAL
  Filled 2019-08-20 (×2): qty 2

## 2019-08-20 MED ORDER — ONDANSETRON HCL 4 MG PO TABS: 4.0000 mg | ORAL_TABLET | Freq: Four times a day (QID) | ORAL | Status: DC | PRN

## 2019-08-20 MED ORDER — LEVOFLOXACIN IN D5W 750 MG/150ML IV SOLN
750.0000 mg | Freq: Once | INTRAVENOUS | Status: DC
  Filled 2019-08-20: qty 150

## 2019-08-20 MED ORDER — ONDANSETRON HCL 4 MG/2ML IJ SOLN: 4.0000 mg | Freq: Four times a day (QID) | INTRAMUSCULAR | Status: DC | PRN

## 2019-08-20 MED ORDER — PRAVASTATIN SODIUM 20 MG PO TABS
10.0000 mg | ORAL_TABLET | Freq: Every day | ORAL | Status: DC
  Administered 2019-08-21 – 2019-08-22 (×2): 10 mg via ORAL
  Filled 2019-08-20 (×3): qty 1

## 2019-08-20 MED ORDER — OXYCODONE-ACETAMINOPHEN 7.5-325 MG PO TABS
1.0000 | ORAL_TABLET | Freq: Four times a day (QID) | ORAL | Status: DC | PRN
  Administered 2019-08-21 – 2019-08-22 (×4): 1 via ORAL
  Filled 2019-08-20 (×3): qty 1

## 2019-08-20 MED ORDER — BUPROPION HCL ER (XL) 150 MG PO TB24
150.0000 mg | ORAL_TABLET | Freq: Every day | ORAL | Status: DC
  Administered 2019-08-21 – 2019-08-23 (×3): 150 mg via ORAL
  Filled 2019-08-20 (×5): qty 1

## 2019-08-20 MED ORDER — ALBUTEROL SULFATE (2.5 MG/3ML) 0.083% IN NEBU: 3.0000 mL | INHALATION_SOLUTION | RESPIRATORY_TRACT | Status: DC | PRN

## 2019-08-20 MED ORDER — PANTOPRAZOLE SODIUM 40 MG PO TBEC
40.0000 mg | DELAYED_RELEASE_TABLET | Freq: Every day | ORAL | Status: DC
  Administered 2019-08-21 – 2019-08-23 (×3): 40 mg via ORAL
  Filled 2019-08-20 (×3): qty 1

## 2019-08-20 NOTE — Progress Notes (Signed)
Pharmacy Antibiotic Note  Cristina Hansen is a 64 y.o. female admitted on 08/20/2019 with UTI.  Pharmacy has been consulted for Levaquin dosing in this patient with a noted cephalexin allergy.  Pharmacist directed to investigate beta-lactam allergy. If history of intolerance, mild allergy, or documented history of use of cephalosporins, pharmacy can adjust levofloxacin to ceftriaxone.   Cephalexin allergic reaction = diarrhea  Per Epic records, patient has tolerated IV Cefazolin in the past  Will change Levaquin to Ceftriaxone and will adjust dose based on BMI = 48  Plan:  Ceftriaxone 2gm IV q24h Need for further dosage adjustment appears unlikely at present.    Will sign off at this time.  Please reconsult if a change in clinical status warrants re-evaluation of dosage.  Height: 5' (152.4 cm) Weight: 247 lb (112 kg) IBW/kg (Calculated) : 45.5  Temp (24hrs), Avg:99.7 F (37.6 C), Min:99 F (37.2 C), Max:100.4 F (38 C)  Recent Labs  Lab 08/20/19 1530  WBC 13.9*  CREATININE 1.36*  LATICACIDVEN 1.6    Estimated Creatinine Clearance: 46.9 mL/min (A) (by C-G formula based on SCr of 1.36 mg/dL (H)).    Allergies  Allergen Reactions  . Amoxicillin-Pot Clavulanate Other (See Comments)    Other Reaction: Other reaction  . Rosuvastatin Other (See Comments)  . Cephalexin Diarrhea  . Latex     Blisters    Thank you for allowing pharmacy to be a part of this patient's care.  Everette Rank, PharmD 08/20/2019 7:25 PM

## 2019-08-20 NOTE — ED Notes (Signed)
[facility] (urology) paged to [place] at [phone] at this time.

## 2019-08-20 NOTE — ED Notes (Signed)
Dr Marily Memos at bedside, aware of vitals

## 2019-08-20 NOTE — ED Notes (Signed)
[facility] called for transport at this time. 

## 2019-08-20 NOTE — ED Provider Notes (Signed)
[facility] EMERGENCY DEPARTMENT Provider Note   CSN: [id number] Arrival date & time: 08/20/19  1447     History Chief Complaint  Patient presents with  . Weakness    Cristina Hansen is a 64 y.o. female.  HPI    Patient presents with concern of weakness.  Onset was about 4 days ago, without clear precipitant.  Since that time she has had generalized weakness, generalized soreness, and headache.  No confusion, vision changes, focal weakness.  No relief with anything, though she notes that she has had diminished oral intake since onset of illness. She does have multiple medical issues, but states that she was in her usual state of health prior to the onset.  EMS reports the patient had a welfare check, and was found to have weakness as above.  She was reportedly awake and alert, ambulated to the rig.  Past Medical History:  Diagnosis Date  . Allergy   . Arthritis   . Asthma    Albuterol inhaler daily as needed  . Cataract    left eye,immature  . Chronic back pain    degenerative arthritis and spondylolisthesis  . Complication of anesthesia    After gastric bypass hours later states she started filling up with fluid and had to be given IV Lasix  . Depression    takes Paxil and Wellbutrin daily  . Diabetes mellitus without complication ([place])    takes Losartan daily to protect kidneys;was on Metformin but has been off since 2010 after gastric bypass  . GERD (gastroesophageal reflux disease)    takes Omeprazole daily  . History of bronchitis 17 yrs ago  . Hyperlipidemia    not on any meds.Last cholesterol was 102 end of Nov 2016  . Insomnia    takes Ambien nightly as needed  . Joint pain   . Joint swelling   . Muscle spasm    takes Flexeril daily  . Numbness    right 4/5 finger and occasionally left leg d/t back pain  . Seasonal allergies    Flovent and Zyrtec daily as needed  . Shortness of breath dyspnea    takes Omeprazole daily  . Sleep apnea    no cpap ,  elevates HOB   . Urinary frequency   . Urinary urgency     Patient Active Problem List   Diagnosis Date Noted  . Morbid obesity with BMI of 40.0-44.9, adult ([place]) 08/14/2015  . Pseudogout of right knee 08/14/2015  . Primary localized osteoarthritis of right knee 07/04/2015  . Acid reflux 06/03/2015  . Allergic rhinitis 06/03/2015  . Asthma 06/03/2015  . Diabetes mellitus with microalbuminuria ([place]) 06/03/2015  . Eczema 06/03/2015  . Hyperlipidemia 06/03/2015  . Hypersomnolence 06/03/2015  . OSA (obstructive sleep apnea) 06/03/2015  . Recurrent major depressive disorder, in full remission ([place]) 06/03/2015  . RLS (restless legs syndrome) 06/03/2015  . Spinal stenosis 06/03/2015  . Status post gastric bypass for obesity 06/03/2015  . Vitamin D deficiency 06/03/2015    Past Surgical History:  Procedure Laterality Date  . CARPAL TUNNEL RELEASE Bilateral   . COLONOSCOPY    . epidural injections.    Marland Kitchen GASTRIC BYPASS  2009  . partial left knee replacement   2005  . TONSILLECTOMY  1972  . TOTAL KNEE ARTHROPLASTY Right 07/04/2015   Procedure: TOTAL RIGHT KNEE ARTHROPLASTY;  Surgeon: Earlie Server, MD;  Location: [facility];  Service: Orthopedics;  Laterality: Right;     OB History   No obstetric  history on file.     Family History  Problem Relation Age of Onset  . Colon polyps Mother   . Colon cancer Neg Hx   . Esophageal cancer Neg Hx   . Rectal cancer Neg Hx   . Stomach cancer Neg Hx     Social History   Tobacco Use  . Smoking status: Former Research scientist (life sciences)  . Smokeless tobacco: Never Used  . Tobacco comment: quit smoking 10 yrs ago  Substance Use Topics  . Alcohol use: No  . Drug use: No    Home Medications Prior to Admission medications   Medication Sig Start Date End Date Taking? Authorizing Provider  AgaMatrix Ultra-Thin Lancets MISC 1 Device by Misc.(Non-Drug; Combo Route) route 2 times daily. ONE TOUCH ULTRA MINI GLUCOMETER 10/24/17   [provider]    albuterol (PROVENTIL HFA;VENTOLIN HFA) 108 (90 Base) MCG/ACT inhaler Inhale 2 puffs into the lungs every 4 (four) hours as needed for wheezing or shortness of breath.    [provider]  aspirin (SB LOW DOSE ASA EC) 81 MG EC tablet Take by mouth.    [provider]  b complex vitamins tablet Take 1 tablet by mouth daily.    [provider]  Blood Glucose Monitoring Suppl (FIFTY50 GLUCOSE METER 2.0) w/Device KIT USE TO CHECK BLOOD SUGAR TWICE DAILY          ONE TOUCH ULTRA MINI GLUCOMETER 10/24/17   [provider]  buPROPion (WELLBUTRIN SR) 200 MG 12 hr tablet Take 200 mg by mouth 2 (two) times daily.    [provider]  buPROPion (WELLBUTRIN XL) 150 MG 24 hr tablet Take 150 mg by mouth daily.    [provider]  cetirizine (ZYRTEC) 10 MG tablet Take 10 mg by mouth daily as needed for allergies. Alternates with Claritin    [provider]  Cholecalciferol (VITAMIN D3) 5000 units TABS Take 5,000-10,000 Units by mouth 2 (two) times daily. 1 tablet in the morning, 2 tablets in the evening    [provider]  colchicine 0.6 MG tablet as needed. For gout as needed 10/04/17   [provider]  cyclobenzaprine (FLEXERIL) 10 MG tablet Take 10 mg by mouth 3 (three) times daily as needed for muscle spasms.    [provider]  DULoxetine (CYMBALTA) 60 MG capsule Take 60 mg by mouth 2 (two) times daily.  05/01/19   [provider]  Ferrous Gluconate-C-Folic Acid (IRON-C PO) Take by mouth.    [provider]  fluticasone (FLONASE) 50 MCG/ACT nasal spray Place into the nose. 05/01/19   [provider]  fluticasone (FLOVENT HFA) 110 MCG/ACT inhaler Inhale 2 puffs into the lungs 2 (two) times daily.    [provider]  glucose blood ([employer] TEST STRIPS) test strip Use to check blood glucose twice daily 05/28/19   [provider]  HYDROcodone-acetaminophen (NORCO) 7.5-325 MG tablet Take  1-2 tablets by mouth every 4 (four) hours as needed for moderate pain or severe pain. Patient not taking: Reported on 06/13/2019 07/04/15   Chadwell, Vonna Kotyk, PA-C  loratadine (CLARITIN) 10 MG tablet Take by mouth.    [provider]  losartan (COZAAR) 25 MG tablet Take 25 mg by mouth daily.    [provider]  meloxicam (MOBIC) 15 MG tablet Take 15 mg by mouth as needed.  05/16/19   [provider]  modafinil (PROVIGIL) 200 MG tablet Take 300 mg by mouth daily.    [provider]  Multiple Vitamins-Minerals (MULTIVITAMIN WITH MINERALS) tablet Take 1 tablet by mouth daily.    [provider]  Omega-3 Fatty Acids (FISH OIL) 1000 MG CAPS Take 2,000 mg by mouth daily.    [provider]  omeprazole (PRILOSEC) 20 MG capsule Take 20 mg by mouth daily as needed (acid reflux).    [provider]  [facility] VERIO test strip  05/28/19   [provider]  oxyCODONE-acetaminophen (PERCOCET) 7.5-325 MG tablet Take by mouth.    [provider]  Pitavastatin Calcium 2 MG TABS Take 1/4 tablet daily. 05/01/19   [provider]  rOPINIRole (REQUIP) 2 MG tablet Take by mouth as needed.     [provider]  vitamin B-12 (CYANOCOBALAMIN) 1000 MCG tablet Take 1,000 mcg by mouth daily.    [provider]  zolpidem (AMBIEN) 5 MG tablet Take 5 mg by mouth at bedtime as needed for sleep.    [provider]    Allergies    Amoxicillin-pot clavulanate, Rosuvastatin, Cephalexin, and Latex  Review of Systems   Review of Systems  Constitutional:       Per HPI, otherwise negative  HENT:       Per HPI, otherwise negative  Respiratory:       Per HPI, otherwise negative  Cardiovascular:       Per HPI, otherwise negative  Gastrointestinal: Negative for vomiting.  Endocrine:       Negative aside from HPI  Genitourinary:       Neg aside from HPI   Musculoskeletal:       Per HPI, otherwise negative  Skin:  Negative.   Neurological: Positive for weakness. Negative for syncope.    Physical Exam Updated Vital Signs BP 116/67 (BP Location: Left Arm)   Pulse (!) 116   Temp (!) 100.4 F (38 C) (Oral)   Resp (!) 27   Ht 5' (1.524 m)   Wt 112 kg   BMI 48.24 kg/m   Physical Exam Vitals and nursing note reviewed.  Constitutional:      General: She is not in acute distress.    Appearance: She is well-developed.     Comments: Obese elderly female sitting upright awake and alert speaking clearly, briefly.  HENT:     Head: Normocephalic and atraumatic.  Eyes:     Conjunctiva/sclera: Conjunctivae normal.  Cardiovascular:     Rate and Rhythm: Regular rhythm. Tachycardia present.     Pulses: Normal pulses.  Pulmonary:     Effort: Pulmonary effort is normal. No respiratory distress.     Breath sounds: Normal breath sounds. No stridor.  Abdominal:     General: There is no distension.     Tenderness: There is no abdominal tenderness. There is no guarding.  Skin:    General: Skin is warm and dry.  Neurological:     Mental Status: She is alert and oriented to person, place, and time.     Cranial Nerves: No cranial nerve deficit.     ED Results / Procedures / Treatments   Labs (all labs ordered are listed, but only abnormal results are displayed) Labs Reviewed  COMPREHENSIVE METABOLIC PANEL - Abnormal; Notable for the following components:      Result Value   Sodium 125 (*)    Potassium 3.2 (*)    Chloride 94 (*)    CO2 20 (*)    Glucose, Bld 155 (*)    BUN 33 (*)    Creatinine, Ser 1.36 (*)  Calcium 8.2 (*)    Total Protein 6.1 (*)    Albumin 2.6 (*)    Alkaline Phosphatase 273 (*)    Total Bilirubin 2.2 (*)    GFR calc non Af Amer 41 (*)    GFR calc Af Amer 47 (*)    All other components within normal limits  CBC WITH DIFFERENTIAL/PLATELET - Abnormal; Notable for the following components:   WBC 13.9 (*)    HCT 35.2 (*)    Platelets 29 (*)    Neutro Abs 11.4 (*)     Lymphs Abs 0.5 (*)    Abs Immature Granulocytes 0.99 (*)    All other components within normal limits  SARS CORONAVIRUS 2 (TAT 6-24 HRS)  CULTURE, BLOOD (ROUTINE X 2)  CULTURE, BLOOD (ROUTINE X 2)  LACTIC ACID, PLASMA  ETHANOL  RAPID URINE DRUG SCREEN, HOSP PERFORMED  URINALYSIS, ROUTINE W REFLEX MICROSCOPIC  POC SARS CORONAVIRUS 2 AG -  ED    EKG EKG Interpretation  Date/Time:  Monday August 20 2019 15:05:51 EST Ventricular Rate:  117 PR Interval:    QRS Duration: 102 QT Interval:  322 QTC Calculation: 450 R Axis:   54 Text Interpretation: Sinus tachycardia T wave abnormality Abnormal ECG Confirmed by Carmin Muskrat [phone]) on 08/20/2019 4:06:04 PM   Radiology CT ABDOMEN PELVIS WO CONTRAST  Result Date: 08/20/2019 CLINICAL DATA:  Generalized weakness and abdominal pain. Concern for diverticulitis. EXAM: CT ABDOMEN AND PELVIS WITHOUT CONTRAST TECHNIQUE: Multidetector CT imaging of the abdomen and pelvis was performed following the standard protocol without IV contrast. Automatic exposure control utilized. COMPARISON:  None. FINDINGS: Lower chest: Borderline cardiomegaly. Mild left circumflex coronary calcification. Small hiatal hernia. Mild subpleural atelectasis in the lingula and dependent lungs. No dependent pleural effusion. Hepatobiliary: A number of small gallstones layering in the dependent gallbladder lumen in the neck region. Gallbladder hydrops measuring 5.7 cm diameter in the fundus and body. No apparent gallbladder wall thickening or para cholecystic fluid or edema. Normal biliary tree and common bile duct calibers. Pancreas: Mild atrophy and fatty infiltration. No acute pancreatitis or apparent main pancreatic duct dilatation. Spleen: Normal. Adrenals/Urinary Tract: Mild diffuse thickening of the left adrenal gland and an 8 mm 14 mm left adrenal benign adenoma. Normal right adrenal gland. An obstructing 5 mm calculus in the left distal ureter with moderate left  hydroureteronephrosis. Asymmetrically increased left renal size and left perinephric and periureteral inflammatory change which could be postobstructive or infectious. A residual punctate nonobstructing micro calcification in the left midpole. No right hydroureteronephrosis or nephroureterolithiasis. Mild right perinephric inflammatory change. No apparent abnormality of the urinary bladder. Stomach/Bowel: Remote gastric bypass. Vascular/Lymphatic: Abnormally clustered small and mildly enlarged periportal and retroperitoneal noncalcified lymph nodes measuring up to 17 by 19 mm. Reproductive: Normally sized uterus. No adnexal cyst or mass. Other: Moderate skeletal degenerative changes. Musculoskeletal: Chronic-appearing right hamstring partial tendinous avulsion with muscle atrophy. Grade 2 anterolisthesis of L5 on S1 with solid osseous fusion at the disc space and L5 bilateral pars interarticularis defects. Grade 1 retrolistheses at L3 on L4, L2 on L3, and L1 on L2 that are likely degenerative. IMPRESSION: 1. Obstructing 5 mm calculus in the left distal ureter with moderate left hydroureteronephrosis. There is asymmetric left renal increased size and left perinephric/periureteral inflammatory change, potentially postobstructive or infectious. Correlation with urinalysis may be useful. 2. Abnormally clustered small and mildly enlarged periportal and retroperitoneal lymph nodes, potentially reactive. Recommend clinical exclusion of an underlying lymphoma. 3. Cholelithiasis and gallbladder hydrops  without evidence of acute gallbladder wall thickening or adjacent inflammatory change or fluid. 4. Benign left adrenal adenoma. 5. Remote gastric bypass. 6. Grade 2 anterolisthesis of L5 on S1 with solid osseous fusion and bilateral pars interarticularis defects. Grade 1 retrolistheses at L3 on L4, L2 on L3, and L1 on L2 that are likely degenerative. 7. Chronic-appearing right hamstring partial tendinous avulsion with muscle  atrophy. Electronically Signed   By: Revonda Humphrey   On: 08/20/2019 18:52   CT HEAD WO CONTRAST  Result Date: 08/20/2019 CLINICAL DATA:  Neuro deficit for several hours, weakness EXAM: CT HEAD WITHOUT CONTRAST TECHNIQUE: Contiguous axial images were obtained from the base of the skull through the vertex without intravenous contrast. COMPARISON:  None. FINDINGS: Brain: No evidence of acute infarction, hemorrhage, hydrocephalus, extra-axial collection or mass lesion/mass effect. Vascular: No hyperdense vessel or unexpected calcification. Skull: Normal. Negative for fracture or focal lesion. Sinuses/Orbits: No acute finding. Other: None. IMPRESSION: No acute intracranial abnormality noted. Electronically Signed   By: Inez Catalina M.D.   On: 08/20/2019 15:52   DG Chest Port 1 View  Result Date: 08/20/2019 CLINICAL DATA:  Altered mental status EXAM: PORTABLE CHEST 1 VIEW COMPARISON:  June 26, 2015. FINDINGS: Lungs are clear. Heart size and pulmonary vascularity are normal. No adenopathy. No bone lesions. IMPRESSION: No edema or consolidation.  Heart size normal.  No adenopathy. Electronically Signed   By: Lowella Grip III M.D.   On: 08/20/2019 15:25    Procedures Procedures (including critical care time)  CRITICAL CARE Performed by: Carmin Muskrat Total critical care time: 45 minutes Critical care time was exclusive of separately billable procedures and treating other patients. Critical care was necessary to treat or prevent imminent or life-threatening deterioration. Critical care was time spent personally by me on the following activities: development of treatment plan with patient and/or surrogate as well as nursing, discussions with consultants, evaluation of patient's response to treatment, examination of patient, obtaining history from patient or surrogate, ordering and performing treatments and interventions, ordering and review of laboratory studies, ordering and review of radiographic  studies, pulse oximetry and re-evaluation of patient's condition.   Medications Ordered in ED Medications  sodium chloride 0.9 % bolus 1,000 mL (0 mLs Intravenous Stopped 08/20/19 1633)    And  0.9 %  sodium chloride infusion ( Intravenous New Bag/Given 08/20/19 1827)  0.9 %  sodium chloride infusion ( Intravenous Stopped 08/20/19 1826)  cefTRIAXone (ROCEPHIN) 2 g in sodium chloride 0.9 % 100 mL IVPB (has no administration in time range)  vancomycin (VANCOCIN) IVPB 1000 mg/200 mL premix (has no administration in time range)  acetaminophen (TYLENOL) tablet 650 mg (650 mg Oral Given 08/20/19 1554)    ED Course  I have reviewed the triage vital signs and the nursing notes.  Pertinent labs & imaging results that were available during my care of the patient were reviewed by me and considered in my medical decision making (see chart for details).   With concern for fever, tachycardia, weakness, patient had broad differential considered, including coronavirus infection, other infectious/metabolic/endocrinologic phenomena.  Absence of focal neuro deficits, is reassuring for low suspicion of CNS etiology.  Fluids, Tylenol started after arrival.  Update:, Initial findings notable for thrombocytopenia with a platelet count of 29.  However, hemoglobin is unremarkable.  Update, additional labs notable for hyponatremia, with a sodium of 125, and evidence for acute kidney injury with elevated creatinine, BUN.  Patient received fluids soon after arrival, will continue to receive these.  With concern for leukocytosis, tachycardia, meeting multiple SIRS criteria she received antibiotics, with likely urinary source and after their evaluation and discussion with her she said that she had previously had abdominal pain.  With this patient had CT scan performed, which is notable for demonstration of a left-sided kidney stone, slight hydronephrosis.  No acute cholecystitis, or other acute findings.  After reviewing the  patient's antibiotic allergies, and with consideration of urine as possible source of infection with unremarkable chest x-ray, no notable skin lesions, patient received Levaquin.  7:31 PM Have discussed patient's case with our urology colleagues on-call, they will follow as a consulting service given concern for obstructing left-sided kidney stone.  Patient will require transfer to our affiliated center where urologic services are performed.  Patient is awake, alert, speaking clearly.  Heart rate is now substantially better than on arrival, temperature has improved as well.  Blood pressure is slightly lower, though her MAP is 67.  She has received almost 2 L fluid resuscitation.   This adult female presents with several days of weakness, after being found in a wellness check.  Patient is awake and alert, but febrile on arrival, meets multiple SIRS criteria.  Patient had fluid resuscitation with 2 L, received Levaquin antibiotics, discussed her case with our urology colleagues, and after speaking with our internal medicine colleagues while she was admitted for further monitoring, management. Final Clinical Impression(s) / ED Diagnoses Final diagnoses:  SIRS (systemic inflammatory response syndrome) ([place])  Hyponatremia  Nephrolithiasis     Carmin Muskrat, MD 08/20/19 1943

## 2019-08-20 NOTE — H&P (Signed)
History and Physical    Cristina Hansen [id number] DOB: 1955/10/23 DOA: 08/20/2019  PCP: Jefm Petty, MD Patient coming from: Home via EMS  Chief Complaint: Generalized weakness.   HPI: Cristina Hansen is a 64 y.o. female with medical history significant of OSA, HLD, GERD, DM, Depression, Gastric bypass, Asthma. Pt reports 4 day h/o progressive weakness. Associated w/ increased urinary frequency and LL abd discomfort. Pt has essentially lost her appetite over this time and reports eating little food but drinking copious water. Denies any medication changes. She only uses her Percocet PRN (every couple of days lately).   ED Course: Pt started on sepsis protocol. Urology consulted and requesting transfer to [facility]  Review of Systems: As per HPI otherwise all other systems reviewed and are negative  Ambulatory Status:No restrictions.   Past Medical History:  Diagnosis Date  . Allergy   . Arthritis   . Asthma    Albuterol inhaler daily as needed  . Cataract    left eye,immature  . Chronic back pain    degenerative arthritis and spondylolisthesis  . Complication of anesthesia    After gastric bypass hours later states she started filling up with fluid and had to be given IV Lasix  . Depression    takes Paxil and Wellbutrin daily  . Diabetes mellitus without complication ([place])    takes Losartan daily to protect kidneys;was on Metformin but has been off since 2010 after gastric bypass  . GERD (gastroesophageal reflux disease)    takes Omeprazole daily  . History of bronchitis 17 yrs ago  . Hyperlipidemia    not on any meds.Last cholesterol was 102 end of Nov 2016  . Insomnia    takes Ambien nightly as needed  . Joint pain   . Joint swelling   . Muscle spasm    takes Flexeril daily  . Numbness    right 4/5 finger and occasionally left leg d/t back pain  . Seasonal allergies    Flovent and Zyrtec daily as needed  . Shortness of breath dyspnea    takes Omeprazole daily    . Sleep apnea    no cpap , elevates HOB   . Urinary frequency   . Urinary urgency     Past Surgical History:  Procedure Laterality Date  . CARPAL TUNNEL RELEASE Bilateral   . COLONOSCOPY    . epidural injections.    Marland Kitchen GASTRIC BYPASS  2009  . partial left knee replacement   2005  . TONSILLECTOMY  1972  . TOTAL KNEE ARTHROPLASTY Right 07/04/2015   Procedure: TOTAL RIGHT KNEE ARTHROPLASTY;  Surgeon: Earlie Server, MD;  Location: [place];  Service: Orthopedics;  Laterality: Right;    Social History   Socioeconomic History  . Marital status: Single    Spouse name: Not on file  . Number of children: Not on file  . Years of education: Not on file  . Highest education level: Not on file  Occupational History  . Not on file  Tobacco Use  . Smoking status: Former Research scientist (life sciences)  . Smokeless tobacco: Never Used  . Tobacco comment: quit smoking 10 yrs ago  Substance and Sexual Activity  . Alcohol use: No  . Drug use: No  . Sexual activity: Yes    Birth control/protection: Surgical  Other Topics Concern  . Not on file  Social History Narrative  . Not on file   Social Determinants of Health   Financial Resource Strain:   . Difficulty of Paying  Living Expenses: Not on file  Food Insecurity:   . Worried About Charity fundraiser in the Last Year: Not on file  . Ran Out of Food in the Last Year: Not on file  Transportation Needs:   . Lack of Transportation (Medical): Not on file  . Lack of Transportation (Non-Medical): Not on file  Physical Activity:   . Days of Exercise per Week: Not on file  . Minutes of Exercise per Session: Not on file  Stress:   . Feeling of Stress : Not on file  Social Connections:   . Frequency of Communication with Friends and Family: Not on file  . Frequency of Social Gatherings with Friends and Family: Not on file  . Attends Religious Services: Not on file  . Active Member of Clubs or Organizations: Not on file  . Attends Archivist Meetings:  Not on file  . Marital Status: Not on file  Intimate Partner Violence:   . Fear of Current or Ex-Partner: Not on file  . Emotionally Abused: Not on file  . Physically Abused: Not on file  . Sexually Abused: Not on file    Allergies  Allergen Reactions  . Amoxicillin-Pot Clavulanate Other (See Comments)    Other Reaction: Other reaction  . Rosuvastatin Other (See Comments)  . Cephalexin Diarrhea  . Latex     Blisters     Family History  Problem Relation Age of Onset  . Colon polyps Mother   . Colon cancer Neg Hx   . Esophageal cancer Neg Hx   . Rectal cancer Neg Hx   . Stomach cancer Neg Hx       Prior to Admission medications   Medication Sig Start Date End Date Taking? Authorizing Provider  albuterol (PROVENTIL HFA;VENTOLIN HFA) 108 (90 Base) MCG/ACT inhaler Inhale 2 puffs into the lungs every 4 (four) hours as needed for wheezing or shortness of breath.   Yes [provider]  aspirin EC 81 MG tablet Take 81 mg by mouth daily.   Yes [provider]  b complex vitamins tablet Take 1 tablet by mouth daily.   Yes [provider]  buPROPion (WELLBUTRIN XL) 150 MG 24 hr tablet Take 150 mg by mouth daily.   Yes [provider]  Cholecalciferol (VITAMIN D3) 5000 units TABS Take 5,000 Units by mouth 2 (two) times daily.    Yes [provider]  DULoxetine (CYMBALTA) 60 MG capsule Take 60 mg by mouth 2 (two) times daily.  05/01/19  Yes [provider]  ferrous sulfate 325 (65 FE) MG tablet Take 325 mg by mouth daily with breakfast.   Yes [provider]  losartan (COZAAR) 25 MG tablet Take 25 mg by mouth daily.   Yes [provider]  meloxicam (MOBIC) 15 MG tablet Take 15 mg by mouth daily.  05/16/19  Yes [provider]  modafinil (PROVIGIL) 200 MG tablet Take 300 mg by mouth daily.   Yes [provider]  Multiple Vitamins-Minerals (MULTIVITAMIN WITH MINERALS) tablet Take 1 tablet by mouth daily.    Yes [provider]  Omega-3 Fatty Acids (FISH OIL) 1000 MG CAPS Take 2,000 mg by mouth daily.   Yes [provider]  omeprazole (PRILOSEC) 40 MG capsule Take 40 mg by mouth daily.    Yes [provider]  oxyCODONE-acetaminophen (PERCOCET) 7.5-325 MG tablet Take 1 tablet by mouth every 6 (six) hours as needed for moderate pain.    Yes [provider]  Pitavastatin Calcium 1 MG TABS Take 0.5 tablets by mouth daily.  05/01/19  Yes [provider]  vitamin B-12 (CYANOCOBALAMIN) 1000 MCG tablet Take 1,000 mcg by mouth daily.   Yes [provider]    Physical Exam: Vitals:   08/20/19 2102 08/20/19 2105 08/20/19 2106 08/20/19 2130  BP: (!) 99/44   (!) 113/59  Pulse: 71 72 74 72  Resp: (!) 27 18 (!) 21   Temp:      TempSrc:      SpO2: 97% 98% 99% 93%  Weight:      Height:         General:  Appears calm and comfortable Eyes:  PERRL, EOMI, normal lids, iris ENT:  grossly normal hearing, lips & tongue, mmm Neck:  no LAD, masses or thyromegaly Cardiovascular: Faint heart sounds. RRR, no m/r/g. trace LE edema.  Respiratory:  CTA bilaterally, no w/r/r. Normal respiratory effort. Abdomen: Obese,  soft, ntnd, NABS Skin:  no rash or induration seen on limited exam, diaphoretic Musculoskeletal:  grossly normal tone BUE/BLE, good ROM, no bony abnormality Psychiatric:  grossly normal mood and affect, speech fluent and appropriate, AOx3 Neurologic:  CN 2-12 grossly intact, moves all extremities in coordinated fashion, sensation intact  Labs on Admission: I have personally reviewed following labs and imaging studies  CBC: Recent Labs  Lab 08/20/19 1530  WBC 13.9*  NEUTROABS 11.4*  HGB 12.0  HCT 35.2*  MCV 85.0  PLT 29*   Basic Metabolic Panel: Recent Labs  Lab 08/20/19 1530  NA 125*  K 3.2*  CL 94*  CO2 20*  GLUCOSE 155*  BUN 33*  CREATININE 1.36*  CALCIUM 8.2*   GFR: Estimated Creatinine Clearance: 46.9 mL/min (A) (by  C-G formula based on SCr of 1.36 mg/dL (H)). Liver Function Tests: Recent Labs  Lab 08/20/19 1530  AST 41  ALT 27  ALKPHOS 273*  BILITOT 2.2*  PROT 6.1*  ALBUMIN 2.6*   No results for input(s): LIPASE, AMYLASE in the last 168 hours. No results for input(s): AMMONIA in the last 168 hours. Coagulation Profile: No results for input(s): INR, PROTIME in the last 168 hours. Cardiac Enzymes: No results for input(s): CKTOTAL, CKMB, CKMBINDEX, TROPONINI in the last 168 hours. BNP (last 3 results) No results for input(s): PROBNP in the last 8760 hours. HbA1C: No results for input(s): HGBA1C in the last 72 hours. CBG: No results for input(s): GLUCAP in the last 168 hours. Lipid Profile: No results for input(s): CHOL, HDL, LDLCALC, TRIG, CHOLHDL, LDLDIRECT in the last 72 hours. Thyroid Function Tests: No results for input(s): TSH, T4TOTAL, FREET4, T3FREE, THYROIDAB in the last 72 hours. Anemia Panel: No results for input(s): VITAMINB12, FOLATE, FERRITIN, TIBC, IRON, RETICCTPCT in the last 72 hours. Urine analysis:    Component Value Date/Time   COLORURINE YELLOW 08/20/2019 1815   APPEARANCEUR CLOUDY (A) 08/20/2019 1815   LABSPEC 1.012 08/20/2019 1815   PHURINE 6.0 08/20/2019 1815   GLUCOSEU NEGATIVE 08/20/2019 1815   HGBUR MODERATE (A) 08/20/2019 1815   BILIRUBINUR NEGATIVE 08/20/2019 1815   KETONESUR NEGATIVE 08/20/2019 1815   PROTEINUR 30 (A) 08/20/2019 1815   NITRITE NEGATIVE 08/20/2019 1815   LEUKOCYTESUR LARGE (A) 08/20/2019 1815    Creatinine Clearance: Estimated Creatinine Clearance: 46.9 mL/min (A) (by C-G formula based on SCr of 1.36 mg/dL (H)).  Sepsis Labs: @LABRCNTIP (procalcitonin:4,lacticidven:4) )No results found for this or any previous visit (from the past 240 hour(s)).   Radiological Exams on Admission: CT ABDOMEN PELVIS WO  CONTRAST  Result Date: 08/20/2019 CLINICAL DATA:  Generalized weakness and abdominal pain. Concern for diverticulitis. EXAM: CT ABDOMEN  AND PELVIS WITHOUT CONTRAST TECHNIQUE: Multidetector CT imaging of the abdomen and pelvis was performed following the standard protocol without IV contrast. Automatic exposure control utilized. COMPARISON:  None. FINDINGS: Lower chest: Borderline cardiomegaly. Mild left circumflex coronary calcification. Small hiatal hernia. Mild subpleural atelectasis in the lingula and dependent lungs. No dependent pleural effusion. Hepatobiliary: A number of small gallstones layering in the dependent gallbladder lumen in the neck region. Gallbladder hydrops measuring 5.7 cm diameter in the fundus and body. No apparent gallbladder wall thickening or para cholecystic fluid or edema. Normal biliary tree and common bile duct calibers. Pancreas: Mild atrophy and fatty infiltration. No acute pancreatitis or apparent main pancreatic duct dilatation. Spleen: Normal. Adrenals/Urinary Tract: Mild diffuse thickening of the left adrenal gland and an 8 mm 14 mm left adrenal benign adenoma. Normal right adrenal gland. An obstructing 5 mm calculus in the left distal ureter with moderate left hydroureteronephrosis. Asymmetrically increased left renal size and left perinephric and periureteral inflammatory change which could be postobstructive or infectious. A residual punctate nonobstructing micro calcification in the left midpole. No right hydroureteronephrosis or nephroureterolithiasis. Mild right perinephric inflammatory change. No apparent abnormality of the urinary bladder. Stomach/Bowel: Remote gastric bypass. Vascular/Lymphatic: Abnormally clustered small and mildly enlarged periportal and retroperitoneal noncalcified lymph nodes measuring up to 17 by 19 mm. Reproductive: Normally sized uterus. No adnexal cyst or mass. Other: Moderate skeletal degenerative changes. Musculoskeletal: Chronic-appearing right hamstring partial tendinous avulsion with muscle atrophy. Grade 2 anterolisthesis of L5 on S1 with solid osseous fusion at the disc  space and L5 bilateral pars interarticularis defects. Grade 1 retrolistheses at L3 on L4, L2 on L3, and L1 on L2 that are likely degenerative. IMPRESSION: 1. Obstructing 5 mm calculus in the left distal ureter with moderate left hydroureteronephrosis. There is asymmetric left renal increased size and left perinephric/periureteral inflammatory change, potentially postobstructive or infectious. Correlation with urinalysis may be useful. 2. Abnormally clustered small and mildly enlarged periportal and retroperitoneal lymph nodes, potentially reactive. Recommend clinical exclusion of an underlying lymphoma. 3. Cholelithiasis and gallbladder hydrops without evidence of acute gallbladder wall thickening or adjacent inflammatory change or fluid. 4. Benign left adrenal adenoma. 5. Remote gastric bypass. 6. Grade 2 anterolisthesis of L5 on S1 with solid osseous fusion and bilateral pars interarticularis defects. Grade 1 retrolistheses at L3 on L4, L2 on L3, and L1 on L2 that are likely degenerative. 7. Chronic-appearing right hamstring partial tendinous avulsion with muscle atrophy. Electronically Signed   By: Revonda Humphrey   On: 08/20/2019 18:52   CT HEAD WO CONTRAST  Result Date: 08/20/2019 CLINICAL DATA:  Neuro deficit for several hours, weakness EXAM: CT HEAD WITHOUT CONTRAST TECHNIQUE: Contiguous axial images were obtained from the base of the skull through the vertex without intravenous contrast. COMPARISON:  None. FINDINGS: Brain: No evidence of acute infarction, hemorrhage, hydrocephalus, extra-axial collection or mass lesion/mass effect. Vascular: No hyperdense vessel or unexpected calcification. Skull: Normal. Negative for fracture or focal lesion. Sinuses/Orbits: No acute finding. Other: None. IMPRESSION: No acute intracranial abnormality noted. Electronically Signed   By: Inez Catalina M.D.   On: 08/20/2019 15:52   DG Chest Port 1 View  Result Date: 08/20/2019 CLINICAL DATA:  Altered mental status EXAM:  PORTABLE CHEST 1 VIEW COMPARISON:  June 26, 2015. FINDINGS: Lungs are clear. Heart size and pulmonary vascularity are normal. No adenopathy. No bone lesions. IMPRESSION: No edema  or consolidation.  Heart size normal.  No adenopathy. Electronically Signed   By: Lowella Grip III M.D.   On: 08/20/2019 15:25    EKG: Independently reviewed. Sinus, tachy, no ACS  Assessment/Plan Active Problems:   Acid reflux   Diabetes mellitus with microalbuminuria (HCC)   Hyperlipidemia   Hypersomnolence   Morbid obesity with BMI of 40.0-44.9, adult (HCC)   OSA (obstructive sleep apnea)   Sepsis secondary to UTI ([place])   Nephrolithiasis   Acute pyelonephritis   Hyponatremia   AKI (acute kidney injury) ([place])   Essential hypertension   Chronic musculoskeletal pain     SIRS vs mild Sepsis: Likely urogenic. Sepsis protocol initiated in the ED. Stable. WBC 13.9, hypotensive, tachycardia, Temp 100.4, Tachypnea, Urogenic source, Cr 1.38. Cuff readings are not likely acurate given pts mentation and stable VS otherwise.  - Continue Sepsis protocol - f/u BCX and UCX - Treatment as below.   Obstructive Nephrolithiasis: 45mm stone in distal ureter w/ moderate hydro and periureteral and perinephric stranding indicative of infection. Dr. Lovena Neighbours of Urology consulted by EDP and requesting pt transfer to [place] to [place] in am secondary to CTX allergy and pt preference due to severe diarrhea - IVF - Ad[facility]pt at [facility]  Hyponatremia: 125 on admission. Likely from poor oral intake outside of copious water for days. Doubt due to SSRI given no recent changes.  Albumin 2.6 likely nutritionally based. Poor UOP since presenting to ED.  - NS 125/hr.  - CMP in am  AKI: Cr. 1.36. Previous nmo at 0.8 in 2017 - IVF as above - CMP ion am  Asthma: stable _ continue home albuterol  HTN: currently hypotensive as above.  - hold home losartan  Depression: - continue home cymbalta sn wellbutrin.   Chronic  pain: MSK bsed. Bulging discs - Continue percocet and mobic. PRN  GERD: - continue ppi  HLD _ continue statin.     DVT prophylaxis: Hep  Code Status: FUll  Family Communication: Attempted to call both contacts listed in pts chart w/o success. Jocelyn Lamer [phone]) and Luetta Nutting [phone])  Disposition Plan: Pending eval by Urology  Consults called: Urology  Admission status: Inpt    Waldemar Dickens MD [facility]  If 7PM-7AM, please contact night-coverage [email or web address] Password [email or web address]  08/20/2019, 10:00 PM

## 2019-08-20 NOTE — ED Notes (Signed)
CRITICAL VALUE ALERT  Critical Value:  Platelet 29  Date & Time Notied:  08/20/19 & 1600  Provider Notified: Dr. Vanita Panda  Orders Received/Actions taken: Notified Dr.

## 2019-08-20 NOTE — ED Triage Notes (Signed)
[facility] stated patient had a welfare check and found patient with generalized weakness that started Thurs. Patient alert, verbal and able to ambulate with a cane with help. Patient verbalized she ate a half a egg this morning. Patient stated incontinence started since she has been feeling weak. Stated just overall weakness and fatigue.

## 2019-08-21 ENCOUNTER — Encounter (HOSPITAL_COMMUNITY): Payer: Self-pay | Admitting: Family Medicine

## 2019-08-21 ENCOUNTER — Encounter (HOSPITAL_COMMUNITY)
Admission: EM | Disposition: A | Discharge status: Home / Self Care | Payer: Self-pay | Source: Home / Self Care | Attending: Internal Medicine

## 2019-08-21 ENCOUNTER — Inpatient Hospital Stay (HOSPITAL_COMMUNITY): Payer: Medicare HMO | Admitting: Certified Registered Nurse Anesthetist

## 2019-08-21 ENCOUNTER — Inpatient Hospital Stay (HOSPITAL_COMMUNITY): Payer: Medicare HMO

## 2019-08-21 DIAGNOSIS — B9620 Unspecified Escherichia coli [E. coli] as the cause of diseases classified elsewhere: Secondary | ICD-10-CM

## 2019-08-21 DIAGNOSIS — R7881 Bacteremia: Secondary | ICD-10-CM

## 2019-08-21 HISTORY — PX: CYSTOSCOPY WITH RETROGRADE PYELOGRAM, URETEROSCOPY AND STENT PLACEMENT: SHX5789

## 2019-08-21 LAB — BASIC METABOLIC PANEL
Anion gap: 8 (ref 5–15)
BUN: 28 mg/dL — ABNORMAL HIGH (ref 8–23)
CO2: 20 mmol/L — ABNORMAL LOW (ref 22–32)
Calcium: 7.8 mg/dL — ABNORMAL LOW (ref 8.9–10.3)
Chloride: 107 mmol/L (ref 98–111)
Creatinine, Ser: 1.01 mg/dL — ABNORMAL HIGH (ref 0.44–1.00)
GFR calc Af Amer: >60 mL/min (ref >60)
GFR calc non Af Amer: 58 mL/min — ABNORMAL LOW (ref >60)
Glucose, Bld: 97 mg/dL (ref 70–99)
Potassium: 3.4 mmol/L — ABNORMAL LOW (ref 3.5–5.1)
Sodium: 135 mmol/L (ref 135–145)

## 2019-08-21 LAB — GLUCOSE, CAPILLARY
Glucose-Capillary: 89 mg/dL (ref 70–99)
Glucose-Capillary: 92 mg/dL (ref 70–99)
Glucose-Capillary: 93 mg/dL (ref 70–99)
Glucose-Capillary: 104 mg/dL — ABNORMAL HIGH (ref 70–99)
Glucose-Capillary: 164 mg/dL — ABNORMAL HIGH (ref 70–99)
Glucose-Capillary: 141 mg/dL — ABNORMAL HIGH (ref 70–99)

## 2019-08-21 LAB — BLOOD CULTURE ID PANEL (REFLEXED)

## 2019-08-21 LAB — CBC
HCT: 32.2 % — ABNORMAL LOW (ref 36.0–46.0)
Hemoglobin: 10.6 g/dL — ABNORMAL LOW (ref 12.0–15.0)
MCH: 28.8 pg (ref 26.0–34.0)
MCHC: 32.9 g/dL (ref 30.0–36.0)
MCV: 87.5 fL (ref 80.0–100.0)
Platelets: 40 10*3/uL — ABNORMAL LOW (ref 150–400)
RBC: 3.68 MIL/uL — ABNORMAL LOW (ref 3.87–5.11)
RDW: 13.8 % (ref 11.5–15.5)
WBC: 16 10*3/uL — ABNORMAL HIGH (ref 4.0–10.5)
nRBC: 0 % (ref 0.0–0.2)

## 2019-08-21 LAB — RESPIRATORY PANEL BY RT PCR (FLU A&B, COVID)
Influenza A by PCR: NEGATIVE
Influenza B by PCR: NEGATIVE
SARS Coronavirus 2 by RT PCR: NEGATIVE

## 2019-08-21 LAB — HEMOGLOBIN A1C
Hgb A1c MFr Bld: 6.3 % — ABNORMAL HIGH (ref 4.8–5.6)
Mean Plasma Glucose: 134.11 mg/dL

## 2019-08-21 LAB — HIV ANTIBODY (ROUTINE TESTING W REFLEX): HIV Screen 4th Generation wRfx: NONREACTIVE

## 2019-08-21 LAB — MRSA PCR SCREENING: MRSA by PCR: NEGATIVE

## 2019-08-21 LAB — SARS CORONAVIRUS 2 (TAT 6-24 HRS): SARS Coronavirus 2: NEGATIVE

## 2019-08-21 SURGERY — CYSTOURETEROSCOPY, WITH RETROGRADE PYELOGRAM AND STENT INSERTION
Anesthesia: General | Laterality: Left

## 2019-08-21 MED ORDER — ONDANSETRON HCL 4 MG/2ML IJ SOLN
INTRAMUSCULAR | Status: AC
  Filled 2019-08-21: qty 2

## 2019-08-21 MED ORDER — FENTANYL CITRATE (PF) 100 MCG/2ML IJ SOLN: 25.0000 ug | INTRAMUSCULAR | Status: DC | PRN

## 2019-08-21 MED ORDER — AMLODIPINE BESYLATE 5 MG PO TABS
5.0000 mg | ORAL_TABLET | Freq: Every day | ORAL | Status: DC
  Administered 2019-08-22 – 2019-08-23 (×2): 5 mg via ORAL
  Filled 2019-08-21 (×2): qty 1

## 2019-08-21 MED ORDER — LACTATED RINGERS IV SOLN: INTRAVENOUS | Status: DC

## 2019-08-21 MED ORDER — MIDAZOLAM HCL 2 MG/2ML IJ SOLN
INTRAMUSCULAR | Status: AC
  Filled 2019-08-21: qty 2

## 2019-08-21 MED ORDER — DEXAMETHASONE SODIUM PHOSPHATE 10 MG/ML IJ SOLN
INTRAMUSCULAR | Status: AC
  Filled 2019-08-21: qty 1

## 2019-08-21 MED ORDER — PHENAZOPYRIDINE HCL 200 MG PO TABS: 200.0000 mg | ORAL_TABLET | Freq: Three times a day (TID) | ORAL | 0 refills | Status: DC | PRN

## 2019-08-21 MED ORDER — PROPOFOL 10 MG/ML IV BOLUS
INTRAVENOUS | Status: DC | PRN
  Administered 2019-08-21: 150 mg via INTRAVENOUS
  Administered 2019-08-21: 50 mg via INTRAVENOUS

## 2019-08-21 MED ORDER — FENTANYL CITRATE (PF) 100 MCG/2ML IJ SOLN
INTRAMUSCULAR | Status: DC | PRN
  Administered 2019-08-21: 25 ug via INTRAVENOUS
  Administered 2019-08-21: 50 ug via INTRAVENOUS

## 2019-08-21 MED ORDER — POTASSIUM CHLORIDE CRYS ER 20 MEQ PO TBCR
20.0000 meq | EXTENDED_RELEASE_TABLET | Freq: Once | ORAL | Status: AC
  Administered 2019-08-21: 20 meq via ORAL
  Filled 2019-08-21: qty 1

## 2019-08-21 MED ORDER — SUCCINYLCHOLINE CHLORIDE 200 MG/10ML IV SOSY
PREFILLED_SYRINGE | INTRAVENOUS | Status: AC
  Filled 2019-08-21: qty 10

## 2019-08-21 MED ORDER — ONDANSETRON HCL 4 MG/2ML IJ SOLN
INTRAMUSCULAR | Status: DC | PRN
  Administered 2019-08-21: 4 mg via INTRAVENOUS

## 2019-08-21 MED ORDER — SUCCINYLCHOLINE CHLORIDE 20 MG/ML IJ SOLN
INTRAMUSCULAR | Status: DC | PRN
  Administered 2019-08-21: 120 mg via INTRAVENOUS

## 2019-08-21 MED ORDER — ONDANSETRON HCL 4 MG/2ML IJ SOLN: 4.0000 mg | Freq: Once | INTRAMUSCULAR | Status: DC | PRN

## 2019-08-21 MED ORDER — DEXAMETHASONE SODIUM PHOSPHATE 10 MG/ML IJ SOLN
INTRAMUSCULAR | Status: DC | PRN
  Administered 2019-08-21: 8 mg via INTRAVENOUS

## 2019-08-21 MED ORDER — PHENOL 1.4 % MT LIQD
1.0000 | OROMUCOSAL | Status: DC | PRN
  Filled 2019-08-21: qty 177

## 2019-08-21 MED ORDER — SODIUM CHLORIDE 0.9 % IR SOLN
Status: DC | PRN
  Administered 2019-08-21: 3000 mL

## 2019-08-21 MED ORDER — FENTANYL CITRATE (PF) 100 MCG/2ML IJ SOLN
INTRAMUSCULAR | Status: AC
  Filled 2019-08-21: qty 2

## 2019-08-21 MED ORDER — LIDOCAINE 2% (20 MG/ML) 5 ML SYRINGE
INTRAMUSCULAR | Status: AC
  Filled 2019-08-21: qty 5

## 2019-08-21 MED ORDER — IOHEXOL 300 MG/ML  SOLN
INTRAMUSCULAR | Status: DC | PRN
  Administered 2019-08-21: 12:00:00 7 mL

## 2019-08-21 MED ORDER — PROPOFOL 10 MG/ML IV BOLUS
INTRAVENOUS | Status: AC
  Filled 2019-08-21: qty 20

## 2019-08-21 MED ORDER — HYDROCODONE-ACETAMINOPHEN 5-325 MG PO TABS: 1.0000 | ORAL_TABLET | ORAL | 0 refills | Status: AC | PRN

## 2019-08-21 MED ORDER — POLYETHYLENE GLYCOL 3350 17 G PO PACK
17.0000 g | PACK | Freq: Every day | ORAL | Status: DC | PRN
  Administered 2019-08-21: 17 g via ORAL
  Filled 2019-08-21: qty 1

## 2019-08-21 MED ORDER — MIDAZOLAM HCL 5 MG/5ML IJ SOLN
INTRAMUSCULAR | Status: DC | PRN
  Administered 2019-08-21 (×2): 1 mg via INTRAVENOUS

## 2019-08-21 MED ORDER — LIDOCAINE HCL (CARDIAC) PF 100 MG/5ML IV SOSY
PREFILLED_SYRINGE | INTRAVENOUS | Status: DC | PRN
  Administered 2019-08-21: 40 mg via INTRAVENOUS
  Administered 2019-08-21: 60 mg via INTRAVENOUS

## 2019-08-21 MED ORDER — CHLORHEXIDINE GLUCONATE CLOTH 2 % EX PADS
6.0000 | MEDICATED_PAD | Freq: Every day | CUTANEOUS | Status: DC
  Administered 2019-08-21 – 2019-08-22 (×2): 6 via TOPICAL

## 2019-08-21 SURGICAL SUPPLY — 20 items
BAG URO CATCHER STRL LF (MISCELLANEOUS) ×3 IMPLANT
BASKET ZERO TIP NITINOL 2.4FR (BASKET) ×2 IMPLANT
BSKT STON RTRVL ZERO TP 2.4FR (BASKET) ×1
CATH URET 5FR 28IN OPEN ENDED (CATHETERS) ×3 IMPLANT
CLOTH BEACON ORANGE TIMEOUT ST (SAFETY) ×3 IMPLANT
EXTRACTOR STONE NITINOL NGAGE (UROLOGICAL SUPPLIES) IMPLANT
FIBER LASER FLEXIVA 365 (UROLOGICAL SUPPLIES) IMPLANT
FIBER LASER TRAC TIP (UROLOGICAL SUPPLIES) ×2 IMPLANT
GLOVE BIOGEL M STRL SZ7.5 (GLOVE) ×3 IMPLANT
GOWN STRL REUS W/TWL XL LVL3 (GOWN DISPOSABLE) ×3 IMPLANT
GUIDEWIRE ZIPWRE .038 STRAIGHT (WIRE) ×3 IMPLANT
KIT TURNOVER KIT A (KITS) IMPLANT
MANIFOLD NEPTUNE II (INSTRUMENTS) ×3 IMPLANT
PACK CYSTO (CUSTOM PROCEDURE TRAY) ×3 IMPLANT
SHEATH URETERAL 12FRX35CM (MISCELLANEOUS) IMPLANT
STENT URET 6FRX24 CONTOUR (STENTS) ×2 IMPLANT
STENT URET 6FRX26 CONTOUR (STENTS) IMPLANT
TUBING CONNECTING 10 (TUBING) ×2 IMPLANT
TUBING CONNECTING 10' (TUBING) ×1
TUBING UROLOGY SET (TUBING) ×3 IMPLANT

## 2019-08-21 NOTE — Discharge Instructions (Signed)
Dietary Guidelines to Help Prevent Kidney Stones Kidney stones are deposits of minerals and salts that form inside your kidneys. Your risk of developing kidney stones may be greater depending on your diet, your lifestyle, the medicines you take, and whether you have certain medical conditions. Most people can reduce their chances of developing kidney stones by following the instructions below. Depending on your overall health and the type of kidney stones you tend to develop, your dietitian may give you more specific instructions. What are tips for following this plan? Reading food labels  Choose foods with "no salt added" or "low-salt" labels. Limit your sodium intake to less than 1500 mg per day.  Choose foods with calcium for each meal and snack. Try to eat about 300 mg of calcium at each meal. Foods that contain 200-500 mg of calcium per serving include: ? 8 oz (237 ml) of milk, fortified nondairy milk, and fortified fruit juice. ? 8 oz (237 ml) of kefir, yogurt, and soy yogurt. ? 4 oz (118 ml) of tofu. ? 1 oz of cheese. ? 1 cup (300 g) of dried figs. ? 1 cup (91 g) of cooked broccoli. ? 1-3 oz can of sardines or mackerel.  Most people need 1000 to 1500 mg of calcium each day. Talk to your dietitian about how much calcium is recommended for you. Shopping  Buy plenty of fresh fruits and vegetables. Most people do not need to avoid fruits and vegetables, even if they contain nutrients that may contribute to kidney stones.  When shopping for convenience foods, choose: ? Whole pieces of fruit. ? Premade salads with dressing on the side. ? Low-fat fruit and yogurt smoothies.  Avoid buying frozen meals or prepared deli foods.  Look for foods with live cultures, such as yogurt and kefir. Cooking  Do not add salt to food when cooking. Place a salt shaker on the table and allow each person to add his or her own salt to taste.  Use vegetable protein, such as beans, textured vegetable  protein (TVP), or tofu instead of meat in pasta, casseroles, and soups. Meal planning   Eat less salt, if told by your dietitian. To do this: ? Avoid eating processed or premade food. ? Avoid eating fast food.  Eat less animal protein, including cheese, meat, poultry, or fish, if told by your dietitian. To do this: ? Limit the number of times you have meat, poultry, fish, or cheese each week. Eat a diet free of meat at least 2 days a week. ? Eat only one serving each day of meat, poultry, fish, or seafood. ? When you prepare animal protein, cut pieces into small portion sizes. For most meat and fish, one serving is about the size of one deck of cards.  Eat at least 5 servings of fresh fruits and vegetables each day. To do this: ? Keep fruits and vegetables on hand for snacks. ? Eat 1 piece of fruit or a handful of berries with breakfast. ? Have a salad and fruit at lunch. ? Have two kinds of vegetables at dinner.  Limit foods that are high in a substance called oxalate. These include: ? Spinach. ? Rhubarb. ? Beets. ? Potato chips and french fries. ? Nuts.  If you regularly take a diuretic medicine, make sure to eat at least 1-2 fruits or vegetables high in potassium each day. These include: ? Avocado. ? Banana. ? Orange, prune, carrot, or tomato juice. ? Baked potato. ? Cabbage. ? Beans and split   peas. General instructions   Drink enough fluid to keep your urine clear or pale yellow. This is the most important thing you can do.  Talk to your health care provider and dietitian about taking daily supplements. Depending on your health and the cause of your kidney stones, you may be advised: ? Not to take supplements with vitamin C. ? To take a calcium supplement. ? To take a daily probiotic supplement. ? To take other supplements such as magnesium, fish oil, or vitamin B6.  Take all medicines and supplements as told by your health care provider.  Limit alcohol intake to no  more than 1 drink a day for nonpregnant women and 2 drinks a day for men. One drink equals 12 oz of beer, 5 oz of wine, or 1 oz of hard liquor.  Lose weight if told by your health care provider. Work with your dietitian to find strategies and an eating plan that works best for you. What foods are not recommended? Limit your intake of the following foods, or as told by your dietitian. Talk to your dietitian about specific foods you should avoid based on the type of kidney stones and your overall health. Grains Breads. Bagels. Rolls. Baked goods. Salted crackers. Cereal. Pasta. Vegetables Spinach. Rhubarb. Beets. Canned vegetables. Pickles. Olives. Meats and other protein foods Nuts. Nut butters. Large portions of meat, poultry, or fish. Salted or cured meats. Deli meats. Hot dogs. Sausages. Dairy Cheese. Beverages Regular soft drinks. Regular vegetable juice. Seasonings and other foods Seasoning blends with salt. Salad dressings. Canned soups. Soy sauce. Ketchup. Barbecue sauce. Canned pasta sauce. Casseroles. Pizza. Lasagna. Frozen meals. Potato chips. French fries. Summary  You can reduce your risk of kidney stones by making changes to your diet.  The most important thing you can do is drink enough fluid. You should drink enough fluid to keep your urine clear or pale yellow.  Ask your health care provider or dietitian how much protein from animal sources you should eat each day, and also how much salt and calcium you should have each day. This information is not intended to replace advice given to you by your health care provider. Make sure you discuss any questions you have with your health care provider. Document Revised: 09/20/2018 Document Reviewed: 05/11/2016 Elsevier Patient Education  2020 [employer]  

## 2019-08-21 NOTE — Progress Notes (Signed)
PROGRESS NOTE    Cristina Hansen    Code Status: Full Code  ZY:[id number] DOB: 1956-03-05 DOA: 08/20/2019 LOS: 1 days  PCP: Jefm Petty, MD CC:  Chief Complaint  Patient presents with  . Weakness       Hospital Summary   This is a 64 year old female with past medical history of OSA, hyperlipidemia, GERD, diabetes, depression, gastric bypass, asthma who presented to [facility] after having increased urinary frequency, abdominal discomfort and found to have SIRS versus mild sepsis and Found to have 5 mm left distal ureteral calculus with moderate hydronephrosis on CT stone study and transferred [facility] for further urologic work-up.  Started on ceftriaxone.  Urology was consulted and patient underwent cystoscopy with left ureteroscopy, lithotripsy and left JJ stent placement on 3/9 with Dr. Lovena Neighbours.  She was also found to have E. coli bacteremia which resulted on 3/9.  She remained hemodynamically stable and was transferred from stepdown to med telemetry.   A & P   Active Problems:   Acid reflux   Diabetes mellitus with microalbuminuria (HCC)   Hyperlipidemia   Hypersomnolence   Morbid obesity with BMI of 40.0-44.9, adult (HCC)   OSA (obstructive sleep apnea)   Sepsis secondary to UTI ([place])   Nephrolithiasis   Acute pyelonephritis   Hyponatremia   AKI (acute kidney injury) ([place])   Essential hypertension   Chronic musculoskeletal pain   1. Sepsis secondary to E. coli bacteremia and left obstructive nephrolithiasis with hydronephrosis a. Febrile, tachycardic and tachypneic on admission with hypotension and, leukocytosis today increased from 13.9-> 16.0, currently afebrile and resting comfortably on room air b. Received NS infusion since admit but has stopped c. Continue ceftriaxone 2. Obstructive 5 mm left UVJ stone with moderate hydroureteronephrosis s/p lithotripsy and JJ stent (with Dr. Lovena Neighbours, 08/21/2019) a. Continue IV antibiotics  b. plan for stent removal in 1  week 3. Periportal/retroperitoneal lymphadenopathy a. Potentially reactive however radiologist reports abnormally clustered and recommending clinical exclusion of underlying lymphoma 4. Benign left adrenal adenoma 5. Cholelithiasis without cholecystitis 6. Hyponatremia, resolved with IV fluids 7. Thrombocytopenia without signs of bleed, likely secondary to sepsis and improving a. Platelets 29 -> 40 b. Continue to follow 8. AKI, Suspect multifactorial from sepsis and hydronephrosis a. Baseline creatinine 0.8, 1.36 on admission, currently 1.0 9. Hypokalemia a. Replete 10. Asthma, stable a. Continue home albuterol 11. Hypertension a. Losartan on hold due to hypotension (has resolved) and AKI b. will add on amlodipine to start tomorrow 12. Depression on Cymbalta and Wellbutrin 13. Chronic pain  a. 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 Chronic partial tenderness avulsion with muscle atrophy of right hamstring c. Continue Percocet and Mobic 14. GERD on PPI 15. Hyperlipidemia on statin  DVT prophylaxis: SCDs, hold heparin due to thrombocytopenia for now Family Communication: Patient's sister has been updated  Disposition Plan:   Patient came from:   Home  Anticipated d/c place: TBD  Barriers to d/c: Medical stability likely discharge in 48 to 72 hours with PT eval  Pressure injury documentation    None  Consultants  Urology  Procedures  3/9 lithotripsy and JJ stent  Antibiotics   Anti-infectives (From admission, onward)   Start     Dose/Rate Route Frequency Ordered Stop   08/20/19 1945  vancomycin (VANCOCIN) IVPB 1000 mg/200 mL premix     1,000 mg 200 mL/hr over 60 Minutes Intravenous  Once 08/20/19 1942 08/21/19 0700   08/20/19 1930  cefTRIAXone (ROCEPHIN) 2 g in sodium chloride  0.9 % 100 mL IVPB     2 g 200 mL/hr over 30 Minutes Intravenous Every 24 hours 08/20/19 1923     08/20/19 1915  levofloxacin (LEVAQUIN) IVPB 750 mg  Status:  Discontinued     750 mg 100 mL/hr over 90 Minutes Intravenous  Once 08/20/19 1908 08/20/19 1923        Subjective   Patient examined post procedure today lying in bed eating lunch.  Currently states she is feeling better than yesterday.  States that she typically has a family and friends call her regularly at home however this past week she did not answer the phone and they sent a wellness check via police as family was worried.  Patient was then transferred to hospital leading to current stay.  Currently doing well.  No complaints.  Objective   Vitals:   08/21/19 1340 08/21/19 1400 08/21/19 1519 08/21/19 1600  BP: 139/63  (!) 131/52 (!) 132/53  Pulse: 78 75 83 83  Resp: (!) 22 17 (!) 21 16  Temp:      TempSrc:      SpO2: 100%  100% 93%  Weight:      Height:        Intake/Output Summary (Last 24 hours) at 08/21/2019 1708 Last data filed at 08/21/2019 1600 Gross per 24 hour  Intake 6148.1 ml  Output 0 ml  Net 6148.1 ml   Filed Weights   08/20/19 1503 08/20/19 2300  Weight: 112 kg 117.8 kg    Examination:  Physical Exam Vitals and nursing note reviewed.  Constitutional:      Appearance: She is obese.  HENT:     Head: Normocephalic.     Mouth/Throat:     Mouth: Mucous membranes are moist.  Eyes:     Conjunctiva/sclera: Conjunctivae normal.  Cardiovascular:     Rate and Rhythm: Normal rate and regular rhythm.     Heart sounds: No murmur.  Pulmonary:     Effort: Pulmonary effort is normal.     Breath sounds: Normal breath sounds.  Abdominal:     General: Abdomen is flat. There is no distension.  Musculoskeletal:        General: No swelling or tenderness.  Neurological:     Mental Status: She is alert. Mental status is at baseline.  Psychiatric:        Mood and Affect: Mood normal.        Behavior:  Behavior normal.     Data Reviewed: I have personally reviewed following labs and imaging studies  CBC: Recent Labs  Lab 08/20/19 1530 08/21/19 0909  WBC 13.9* 16.0*  NEUTROABS 11.4*  --   HGB 12.0 10.6*  HCT 35.2* 32.2*  MCV 85.0 87.5  PLT 29* 40*   Basic Metabolic Panel: Recent Labs  Lab 08/20/19 1530 08/21/19 0909  NA 125* 135  K 3.2* 3.4*  CL 94* 107  CO2 20* 20*  GLUCOSE 155* 97  BUN 33* 28*  CREATININE 1.36* 1.01*  CALCIUM 8.2* 7.8*   GFR: Estimated Creatinine Clearance: 65.2 mL/min (A) (by C-G formula based on SCr of 1.01 mg/dL (H)). Liver Function Tests: Recent Labs  Lab 08/20/19 1530  AST 41  ALT 27  ALKPHOS 273*  BILITOT 2.2*  PROT 6.1*  ALBUMIN 2.6*   No results for input(s): LIPASE, AMYLASE in the last 168 hours. No results for input(s): AMMONIA in the last 168 hours. Coagulation Profile: No results for input(s): INR, PROTIME in the last 168 hours. Cardiac Enzymes: No results for input(s): CKTOTAL, CKMB, CKMBINDEX, TROPONINI in the last 168 hours. BNP (last 3 results) No results for input(s): PROBNP in the last 8760 hours. HbA1C: Recent Labs    08/20/19 1530  HGBA1C 6.3*   CBG: Recent Labs  Lab 08/21/19 0759 08/21/19 1024 08/21/19 1204  GLUCAP 89 92 93   Lipid Profile: No results for input(s): CHOL, HDL, LDLCALC, TRIG, CHOLHDL, LDLDIRECT in the last 72 hours. Thyroid Function Tests: No results for input(s): TSH, T4TOTAL, FREET4, T3FREE, THYROIDAB in the last 72 hours. Anemia Panel: No results for input(s): VITAMINB12, FOLATE, FERRITIN, TIBC, IRON, RETICCTPCT in the last 72 hours. Sepsis Labs: Recent Labs  Lab 08/20/19 1530  LATICACIDVEN 1.6    Recent Results (from the past 240 hour(s))  SARS CORONAVIRUS 2 (TAT 6-24 HRS) Nasopharyngeal Nasopharyngeal Swab     Status: None   Collection Time: 08/20/19  6:15 PM   Specimen: Nasopharyngeal Swab  Result Value Ref Range Status   SARS Coronavirus 2 NEGATIVE NEGATIVE Final     Comment: (NOTE) SARS-CoV-2 target nucleic acids are NOT DETECTED. The SARS-CoV-2 RNA is generally detectable in upper and lower respiratory specimens during the acute phase of infection. Negative results do not preclude SARS-CoV-2 infection, do not rule out co-infections with other pathogens, and should not be used as the sole basis for treatment or other patient management decisions. Negative results must be combined with clinical observations, patient history, and epidemiological information. The expected result is Negative. Fact Sheet for Patients: SugarRoll.be Fact Sheet for Healthcare Providers: https://www.woods-mathews.com/ This test is not yet approved or cleared by the [place] FDA and  has been authorized for detection and/or diagnosis of SARS-CoV-2 by FDA under an Emergency Use Authorization (EUA). This EUA will remain  in effect (meaning this test can be used) for the duration of the COVID-19 declaration under Section 56 4(b)(1) of the Act, 21 U.S.C. section 360bbb-3(b)(1), unless the authorization is terminated or revoked sooner. Performed at [facility], [place] [address] [place], [place] [address]   Culture, blood (x 2)     Status: None (Preliminary result)   Collection Time: 08/20/19  7:25 PM   Specimen: BLOOD  Result Value Ref Range Status   Specimen Description   Final    BLOOD RIGHT ANTECUBITAL Performed at [facility], [address] [place], [place] [address]    Special Requests   Final    BOTTLES DRAWN AEROBIC AND ANAEROBIC Blood Culture adequate volume Performed at [facility], [address], [place], [place] [address]    Culture  Setup Time   Final    GRAM NEGATIVE RODS IN BOTH AEROBIC AND ANAEROBIC BOTTLES Gram Stain Report Called to,Read Back By and Verified With: MUELLER G. AT [facility] ICU AT 0926A ON [id number] BY THOMPSON S. Organism ID to follow CRITICAL RESULT CALLED TO, READ BACK BY AND VERIFIED WITH: D  [place] PHARMD  1502 08/21/19 A BROWNING Performed at [place] [address] [place], [place] [address]    Culture GRAM NEGATIVE RODS  Final   Report Status PENDING  Incomplete  Culture, blood (x 2)     Status: None (Preliminary result)   Collection Time: 08/20/19  7:25 PM   Specimen: BLOOD  Result Value Ref Range Status   Specimen Description BLOOD BLOOD RIGHT WRIST  Final   Special Requests   Final    BOTTLES DRAWN AEROBIC AND ANAEROBIC Blood Culture adequate volume   Culture  Setup Time   Final    GRAM NEGATIVE RODS ANAEROBIC BOTTLE ONLY PREVIOUSLY CALLED OTHER SET TO MUELLER G. AT [facility] ICU AT 0926A ON [id number] BY THOMPSON S. AEROBIC BOTTLE ALSO Performed at [facility], [address], [place], [place] [address]    Culture PENDING  Incomplete   Report Status PENDING  Incomplete  Blood Culture ID Panel (Reflexed)     Status: Abnormal   Collection Time: 08/20/19  7:25 PM  Result Value Ref Range Status   Enterococcus species NOT DETECTED NOT DETECTED Final   Listeria monocytogenes NOT DETECTED NOT DETECTED Final   Staphylococcus species NOT DETECTED NOT DETECTED Final   Staphylococcus aureus (BCID) NOT DETECTED NOT DETECTED Final   Streptococcus species NOT DETECTED NOT DETECTED Final   Streptococcus agalactiae NOT DETECTED NOT DETECTED Final   Streptococcus pneumoniae NOT DETECTED NOT DETECTED Final   Streptococcus pyogenes NOT DETECTED NOT DETECTED Final   Acinetobacter baumannii NOT DETECTED NOT DETECTED Final   Enterobacteriaceae species DETECTED (A) NOT DETECTED Final    Comment: Enterobacteriaceae represent a large family of gram-negative bacteria, not a single organism. CRITICAL RESULT CALLED TO, READ BACK BY AND VERIFIED WITH: D [facility] PHARMD 1502 08/21/19 A BROWNING    Enterobacter cloacae complex NOT DETECTED NOT DETECTED Final   Escherichia coli DETECTED (A) NOT DETECTED Final    Comment: CRITICAL RESULT CALLED TO, READ BACK BY AND VERIFIED WITH: D [facility]  PHARMD 1502 08/21/19 A BROWNING    Klebsiella oxytoca NOT DETECTED NOT DETECTED Final   Klebsiella pneumoniae NOT DETECTED NOT DETECTED Final   Proteus species NOT DETECTED NOT DETECTED Final   Serratia marcescens NOT DETECTED NOT DETECTED Final   Carbapenem resistance NOT DETECTED NOT DETECTED Final   Haemophilus influenzae NOT DETECTED NOT DETECTED Final   Neisseria meningitidis NOT DETECTED NOT DETECTED Final   Pseudomonas aeruginosa NOT DETECTED NOT DETECTED Final   Candida albicans NOT DETECTED NOT DETECTED Final   Candida glabrata NOT DETECTED NOT DETECTED Final   Candida krusei NOT DETECTED NOT DETECTED Final   Candida parapsilosis NOT DETECTED NOT DETECTED Final   Candida tropicalis NOT DETECTED NOT DETECTED Final    Comment: Performed at [facility], [place] [address], [place], [place] [address]  MRSA PCR Screening     Status: None   Collection Time: 08/20/19 11:56 PM   Specimen: Nasal Mucosa; Nasopharyngeal  Result Value Ref Range Status   MRSA by PCR NEGATIVE NEGATIVE Final    Comment:        The GeneXpert MRSA Assay (FDA approved for NASAL specimens only), is one component of a comprehensive MRSA colonization surveillance program. It is not intended to diagnose MRSA infection nor to guide or monitor treatment for MRSA infections. Performed at [facility], [place] [address], [place], [place] [address]   Respiratory Panel by RT PCR (Flu A&B, Covid) - Nasopharyngeal Swab     Status: None  Collection Time: 08/21/19  8:49 AM   Specimen: Nasopharyngeal Swab  Result Value Ref Range Status   SARS Coronavirus 2 by RT PCR NEGATIVE NEGATIVE Final    Comment: (NOTE) SARS-CoV-2 target nucleic acids are NOT DETECTED. The SARS-CoV-2 RNA is generally detectable in upper respiratoy specimens during the acute phase of infection. The lowest concentration of SARS-CoV-2 viral copies this assay can detect is 131 copies/mL. A negative result does not preclude  SARS-Cov-2 infection and should not be used as the sole basis for treatment or other patient management decisions. A negative result may occur with  improper specimen collection/handling, submission of specimen other than nasopharyngeal swab, presence of viral mutation(s) within the areas targeted by this assay, and inadequate number of viral copies (<131 copies/mL). A negative result must be combined with clinical observations, patient history, and epidemiological information. The expected result is Negative. Fact Sheet for Patients:  PinkCheek.be Fact Sheet for Healthcare Providers:  GravelBags.it This test is not yet ap proved or cleared by the [place] FDA and  has been authorized for detection and/or diagnosis of SARS-CoV-2 by FDA under an Emergency Use Authorization (EUA). This EUA will remain  in effect (meaning this test can be used) for the duration of the COVID-19 declaration under Section 564(b)(1) of the Act, 21 U.S.C. section 360bbb-3(b)(1), unless the authorization is terminated or revoked sooner.    Influenza A by PCR NEGATIVE NEGATIVE Final   Influenza B by PCR NEGATIVE NEGATIVE Final    Comment: (NOTE) The Xpert Xpress SARS-CoV-2/FLU/RSV assay is intended as an aid in  the diagnosis of influenza from Nasopharyngeal swab specimens and  should not be used as a sole basis for treatment. Nasal washings and  aspirates are unacceptable for Xpert Xpress SARS-CoV-2/FLU/RSV  testing. Fact Sheet for Patients: PinkCheek.be Fact Sheet for Healthcare Providers: GravelBags.it This test is not yet approved or cleared by the [place] FDA and  has been authorized for detection and/or diagnosis of SARS-CoV-2 by  FDA under an Emergency Use Authorization (EUA). This EUA will remain  in effect (meaning this test can be used) for the duration of the  Covid-19  declaration under Section 564(b)(1) of the Act, 21  U.S.C. section 360bbb-3(b)(1), unless the authorization is  terminated or revoked. Performed at [facility], [place] [address], [place], [place] [address]          Radiology Studies: CT ABDOMEN PELVIS WO CONTRAST  Result Date: 08/20/2019 CLINICAL DATA:  Generalized weakness and abdominal pain. Concern for diverticulitis. EXAM: CT ABDOMEN AND PELVIS WITHOUT CONTRAST TECHNIQUE: Multidetector CT imaging of the abdomen and pelvis was performed following the standard protocol without IV contrast. Automatic exposure control utilized. COMPARISON:  None. FINDINGS: Lower chest: Borderline cardiomegaly. Mild left circumflex coronary calcification. Small hiatal hernia. Mild subpleural atelectasis in the lingula and dependent lungs. No dependent pleural effusion. Hepatobiliary: A number of small gallstones layering in the dependent gallbladder lumen in the neck region. Gallbladder hydrops measuring 5.7 cm diameter in the fundus and body. No apparent gallbladder wall thickening or para cholecystic fluid or edema. Normal biliary tree and common bile duct calibers. Pancreas: Mild atrophy and fatty infiltration. No acute pancreatitis or apparent main pancreatic duct dilatation. Spleen: Normal. Adrenals/Urinary Tract: Mild diffuse thickening of the left adrenal gland and an 8 mm 14 mm left adrenal benign adenoma. Normal right adrenal gland. An obstructing 5 mm calculus in the left distal ureter with moderate left hydroureteronephrosis. Asymmetrically increased left renal size and left perinephric and  periureteral inflammatory change which could be postobstructive or infectious. A residual punctate nonobstructing micro calcification in the left midpole. No right hydroureteronephrosis or nephroureterolithiasis. Mild right perinephric inflammatory change. No apparent abnormality of the urinary bladder. Stomach/Bowel: Remote gastric bypass.  Vascular/Lymphatic: Abnormally clustered small and mildly enlarged periportal and retroperitoneal noncalcified lymph nodes measuring up to 17 by 19 mm. Reproductive: Normally sized uterus. No adnexal cyst or mass. Other: Moderate skeletal degenerative changes. Musculoskeletal: Chronic-appearing right hamstring partial tendinous avulsion with muscle atrophy. Grade 2 anterolisthesis of L5 on S1 with solid osseous fusion at the disc space and L5 bilateral pars interarticularis defects. Grade 1 retrolistheses at L3 on L4, L2 on L3, and L1 on L2 that are likely degenerative. IMPRESSION: 1. Obstructing 5 mm calculus in the left distal ureter with moderate left hydroureteronephrosis. There is asymmetric left renal increased size and left perinephric/periureteral inflammatory change, potentially postobstructive or infectious. Correlation with urinalysis may be useful. 2. Abnormally clustered small and mildly enlarged periportal and retroperitoneal lymph nodes, potentially reactive. Recommend clinical exclusion of an underlying lymphoma. 3. Cholelithiasis and gallbladder hydrops without evidence of acute gallbladder wall thickening or adjacent inflammatory change or fluid. 4. Benign left adrenal adenoma. 5. Remote gastric bypass. 6. Grade 2 anterolisthesis of L5 on S1 with solid osseous fusion and bilateral pars interarticularis defects. Grade 1 retrolistheses at L3 on L4, L2 on L3, and L1 on L2 that are likely degenerative. 7. Chronic-appearing right hamstring partial tendinous avulsion with muscle atrophy. Electronically Signed   By: Revonda Humphrey   On: 08/20/2019 18:52   CT HEAD WO CONTRAST  Result Date: 08/20/2019 CLINICAL DATA:  Neuro deficit for several hours, weakness EXAM: CT HEAD WITHOUT CONTRAST TECHNIQUE: Contiguous axial images were obtained from the base of the skull through the vertex without intravenous contrast. COMPARISON:  None. FINDINGS: Brain: No evidence of acute infarction, hemorrhage, hydrocephalus,  extra-axial collection or mass lesion/mass effect. Vascular: No hyperdense vessel or unexpected calcification. Skull: Normal. Negative for fracture or focal lesion. Sinuses/Orbits: No acute finding. Other: None. IMPRESSION: No acute intracranial abnormality noted. Electronically Signed   By: Inez Catalina M.D.   On: 08/20/2019 15:52   DG Chest Port 1 View  Result Date: 08/20/2019 CLINICAL DATA:  Altered mental status EXAM: PORTABLE CHEST 1 VIEW COMPARISON:  June 26, 2015. FINDINGS: Lungs are clear. Heart size and pulmonary vascularity are normal. No adenopathy. No bone lesions. IMPRESSION: No edema or consolidation.  Heart size normal.  No adenopathy. Electronically Signed   By: Lowella Grip III M.D.   On: 08/20/2019 15:25   DG C-Arm 1-60 Min-No Report  Result Date: 08/21/2019 Fluoroscopy was utilized by the requesting physician.  No radiographic interpretation.        Scheduled Meds: . buPROPion  150 mg Oral Daily  . Chlorhexidine Gluconate Cloth  6 each Topical Daily  . DULoxetine  60 mg Oral BID  . heparin  5,000 Units Subcutaneous Q8H  . insulin aspart  0-6 Units Subcutaneous TID WC  . modafinil  300 mg Oral Daily  . pantoprazole  40 mg Oral Daily  . pravastatin  10 mg Oral q1800   Continuous Infusions: . sodium chloride 125 mL/hr at 08/20/19 1827  . sodium chloride Stopped (08/20/19 1826)  . sodium chloride Stopped (08/21/19 1013)  . cefTRIAXone (ROCEPHIN)  IV Stopped (08/20/19 2047)     Time spent: 30 minutes with over 50% of the time coordinating the patient's care    Harold Hedge, DO [facility] Pager  [phone]  Call night coverage person covering after 7pm

## 2019-08-21 NOTE — Op Note (Signed)
Operative Note  Preoperative diagnosis:  1.  5 mm left UVJ stone  Postoperative diagnosis: 1.  5 mm left UVJ stone  Procedure(s): 1.  Cystoscopy with left ureteroscopy, holmium laser lithotripsy and left JJ stent placement 2.  Left retrograde pyelogram with intraoperative interpretation of fluoroscopic imaging  Surgeon: Ellison Hughs, MD  Assistants:  None  Anesthesia:  General  Complications:  None  EBL: Less than 5 mL  Specimens: 1.  Left ureteral stone  Drains/Catheters: 1.  Left 6 French, 24 cm JJ stent without tether  Intraoperative findings:   1. 5 mm left distal ureteral calculus 2. Solitary left collecting system with a filling defect in the very distal aspects of the left ureter, consistent with the stone seen on recent cross-sectional imaging.  There were no filling defects within the remainder of the proximal ureter or within the left renal pelvis.  Indication:  Cristina Hansen is a 64 y.o. female with an obstructing 5 mm left distal ureteral calculus.  She has been consented for the above procedures, voices understanding and wishes to proceed.  Description of procedure:  After informed consent was obtained, the patient was brought to the operating room and general LMA anesthesia was administered. The patient was then placed in the dorsolithotomy position and prepped and draped in the usual sterile fashion. A timeout was performed. A 23 French rigid cystoscope was then inserted into the urethral meatus and advanced into the bladder under direct vision. A complete bladder survey revealed no intravesical pathology.  A 5 French ureteral catheter was then inserted into the left ureteral orifice and a retrograde pyelogram was obtained, with the findings listed above.  A Glidewire was then used to intubate the lumen of the ureteral catheter and was advanced up to the left renal pelvis, under fluoroscopic guidance.  The catheter was then removed, leaving the wire in  place.  A semirigid ureteroscope was then advanced into the distal aspects of the left ureter where her obstructing stone was immediately identified.  A 200 m holmium laser was then used to fracture the stone into multiple smaller pieces.  A 0 tip basket was then used to extract all stone fragments from the lumen of the left ureter.  The semirigid ureteroscope was then removed, leaving the wire in place.  A 6 French, 24 cm JJ stent was then placed over the wire and into good position within the left collecting system, confirming placement via fluoroscopy.  The patient's bladder was drained and all stone fragments were removed.  She tolerated the procedure well and was transferred to the postanesthesia in stable condition.  Plan: Continue IV antibiotics until urine cultures return.  Plan for stent removal in 1 week

## 2019-08-21 NOTE — Progress Notes (Signed)
   08/21/19 1000  Clinical Encounter Type  Visited With Patient not available  Visit Type Initial;Psychological support;Spiritual support  Referral From Nurse  Consult/Referral To Chaplain  Spiritual Encounters  Spiritual Needs Emotional;Brochure;Other (Comment) (Advance Directive )  Stress Factors  Patient Stress Factors Not reviewed  Advance Directives (For Healthcare)  Does Patient Have a Medical Advance Directive? No  Would patient like information on creating a medical advance directive? Yes (Inpatient - patient requests chaplain consult to create a medical advance directive) (Paperwork provided at bedside)   I attempted to visit with Cristina Hansen per spiritual care consult for an Advance Directive. Cristina Hansen was not in her room at the time of my visit. I left a copy of the Advance Directive on her nightstand and we will follow up at a later time.   Please, contact Spiritual Care for further assistance.   Chaplain Shanon Ace M.Div., [facility]

## 2019-08-21 NOTE — Transfer of Care (Signed)
Immediate Anesthesia Transfer of Care Note  Patient: Cristina Hansen  Procedure(s) Performed: CYSTOSCOPY WITH RETROGRADE PYELOGRAM, URETEROSCOPY AND STENT PLACEMENT (Left )  Patient Location: PACU  Anesthesia Type:General  Level of Consciousness: drowsy, patient cooperative and responds to stimulation  Airway & Oxygen Therapy: Patient Spontanous Breathing and Patient connected to face mask oxygen  Post-op Assessment: Report given to RN and Post -op Vital signs reviewed and stable  Post vital signs: Reviewed and stable  Last Vitals:  Vitals Value Taken Time  BP 162/76 08/21/19 1202  Temp    Pulse 89 08/21/19 1204  Resp 22 08/21/19 1204  SpO2 100 % 08/21/19 1204  Vitals shown include unvalidated device data.  Last Pain:  Vitals:   08/21/19 1038  TempSrc:   PainSc: 0-No pain      Patients Stated Pain Goal: 1 ([id number])  Complications: No apparent anesthesia complications

## 2019-08-21 NOTE — Anesthesia Preprocedure Evaluation (Addendum)
Anesthesia Evaluation  Patient identified by MRN, date of birth, ID band Patient awake    Reviewed: Allergy & Precautions, NPO status , Patient's Chart, lab work & pertinent test results  Airway Mallampati: III  TM Distance: >3 FB Neck ROM: Full    Dental no notable dental hx.    Pulmonary shortness of breath, asthma , sleep apnea , former smoker,    Pulmonary exam normal breath sounds clear to auscultation       Cardiovascular hypertension, Pt. on medications Normal cardiovascular exam Rhythm:Regular Rate:Normal  ECG: ST, rate 117   Neuro/Psych PSYCHIATRIC DISORDERS Depression negative neurological ROS     GI/Hepatic Neg liver ROS, GERD  Medicated and Controlled,  Endo/Other  diabetesMorbid obesity (SUPER)  Renal/GU Renal disease     Musculoskeletal negative musculoskeletal ROS (+) Chronic back pain   Abdominal   Peds  Hematology  (+) anemia , Thrombocytopenia  HLD   Anesthesia Other Findings left ureteral obstruction  Reproductive/Obstetrics                             Anesthesia Physical Anesthesia Plan  ASA: IV  Anesthesia Plan: General   Post-op Pain Management:    Induction: Intravenous  PONV Risk Score and Plan: 4 or greater and Ondansetron, Dexamethasone, Midazolam and Treatment may vary due to age or medical condition  Airway Management Planned: Oral ETT  Additional Equipment:   Intra-op Plan:   Post-operative Plan: Extubation in OR  Informed Consent: I have reviewed the patients History and Physical, chart, labs and discussed the procedure including the risks, benefits and alternatives for the proposed anesthesia with the patient or authorized representative who has indicated his/her understanding and acceptance.     Dental advisory given  Plan Discussed with: CRNA  Anesthesia Plan Comments:         Anesthesia Quick Evaluation

## 2019-08-21 NOTE — Progress Notes (Signed)
Code Sepsis with hypotension. Did not receives the required IV resuscitation due to Cuff readings are not likely acurate given pts mentation and stable VS otherwise.

## 2019-08-21 NOTE — Anesthesia Procedure Notes (Signed)
Procedure Name: Intubation Date/Time: 08/21/2019 11:19 AM Performed by: Glory Buff, CRNA Pre-anesthesia Checklist: Patient identified, Emergency Drugs available, Suction available and Patient being monitored Patient Re-evaluated:Patient Re-evaluated prior to induction Oxygen Delivery Method: Circle system utilized Preoxygenation: Pre-oxygenation with 100% oxygen Induction Type: IV induction Ventilation: Mask ventilation without difficulty Laryngoscope Size: Miller and 3 Grade View: Grade I Tube type: Oral Tube size: 7.0 mm Number of attempts: 1 Airway Equipment and Method: Stylet and Oral airway Placement Confirmation: ETT inserted through vocal cords under direct vision,  positive ETCO2 and breath sounds checked- equal and bilateral Secured at: 21 cm Tube secured with: Tape Dental Injury: Teeth and Oropharynx as per pre-operative assessment

## 2019-08-21 NOTE — Progress Notes (Signed)
Patient spoke to a sister via phone before surgery.  She updated her on what was going on.  Patient states she is 1 of 10 siblings.  Did not catch which sister the patient was speaking to, but they did talk for about 7 minutes on the phone.

## 2019-08-21 NOTE — Anesthesia Postprocedure Evaluation (Signed)
Anesthesia Post Note  Patient: Madilyn Fireman  Procedure(s) Performed: CYSTOSCOPY WITH RETROGRADE PYELOGRAM, URETEROSCOPY AND STENT PLACEMENT (Left )     Patient location during evaluation: PACU Anesthesia Type: General Level of consciousness: awake Pain management: pain level controlled Vital Signs Assessment: post-procedure vital signs reviewed and stable Respiratory status: spontaneous breathing, nonlabored ventilation, respiratory function stable and patient connected to nasal cannula oxygen Cardiovascular status: blood pressure returned to baseline and stable Postop Assessment: no apparent nausea or vomiting Anesthetic complications: no    Last Vitals:  Vitals:   08/21/19 1519 08/21/19 1600  BP: (!) 131/52 (!) 132/53  Pulse: 83 83  Resp: (!) 21 16  Temp:    SpO2: 100% 93%    Last Pain:  Vitals:   08/21/19 1240  TempSrc:   PainSc: 0-No pain                 Cayce Paschal P Lillianah Swartzentruber

## 2019-08-21 NOTE — Consult Note (Signed)
Urology Consult   Physician requesting consult: Linna Darner, MD  Reason for consult: Left ureteral stone with UTI  History of Present Illness: Cristina Hansen is a 64 y.o. female with a 3-day history of intermittent episodes of left-sided flank pain associated with decreased appetite and urinary urgency.  She has no prior history of kidney stones.  CT stone study from yesterday showed a 5 mm left distal ureteral calculus with moderate hydronephrosis.  UA from 08/20/2019 was positive for leukocytes, but showed no other overt signs of a UTI.  Her serum creatinine is at 1.36 with a baseline of around 1.0.  The patient denies a history of voiding or storage urinary symptoms, hematuria, UTIs, STDs, urolithiasis, GU malignancy/trauma/surgery.  Past Medical History:  Diagnosis Date  . Allergy   . Arthritis   . Asthma    Albuterol inhaler daily as needed  . Cataract    left eye,immature  . Chronic back pain    degenerative arthritis and spondylolisthesis  . Complication of anesthesia    After gastric bypass hours later states she started filling up with fluid and had to be given IV Lasix  . Depression    takes Paxil and Wellbutrin daily  . Diabetes mellitus without complication ([place])    takes Losartan daily to protect kidneys;was on Metformin but has been off since 2010 after gastric bypass  . GERD (gastroesophageal reflux disease)    takes Omeprazole daily  . History of bronchitis 17 yrs ago  . Hyperlipidemia    not on any meds.Last cholesterol was 102 end of Nov 2016  . Insomnia    takes Ambien nightly as needed  . Joint pain   . Joint swelling   . Muscle spasm    takes Flexeril daily  . Numbness    right 4/5 finger and occasionally left leg d/t back pain  . Seasonal allergies    Flovent and Zyrtec daily as needed  . Shortness of breath dyspnea    takes Omeprazole daily  . Sleep apnea    no cpap , elevates HOB   . Urinary frequency   . Urinary urgency     Past Surgical  History:  Procedure Laterality Date  . CARPAL TUNNEL RELEASE Bilateral   . COLONOSCOPY    . epidural injections.    Marland Kitchen GASTRIC BYPASS  2009  . partial left knee replacement   2005  . TONSILLECTOMY  1972  . TOTAL KNEE ARTHROPLASTY Right 07/04/2015   Procedure: TOTAL RIGHT KNEE ARTHROPLASTY;  Surgeon: Earlie Server, MD;  Location: [facility];  Service: Orthopedics;  Laterality: Right;    Current Hospital Medications:  Home Meds:  Current Meds  Medication Sig  . albuterol (PROVENTIL HFA;VENTOLIN HFA) 108 (90 Base) MCG/ACT inhaler Inhale 2 puffs into the lungs every 4 (four) hours as needed for wheezing or shortness of breath.  Marland Kitchen aspirin EC 81 MG tablet Take 81 mg by mouth daily.  Marland Kitchen b complex vitamins tablet Take 1 tablet by mouth daily.  Marland Kitchen buPROPion (WELLBUTRIN XL) 150 MG 24 hr tablet Take 150 mg by mouth daily.  . Cholecalciferol (VITAMIN D3) 5000 units TABS Take 5,000 Units by mouth 2 (two) times daily.   . DULoxetine (CYMBALTA) 60 MG capsule Take 60 mg by mouth 2 (two) times daily.   . ferrous sulfate 325 (65 FE) MG tablet Take 325 mg by mouth daily with breakfast.  . losartan (COZAAR) 25 MG tablet Take 25 mg by mouth daily.  . meloxicam (MOBIC) 15  MG tablet Take 15 mg by mouth daily.   . modafinil (PROVIGIL) 200 MG tablet Take 300 mg by mouth daily.  . Multiple Vitamins-Minerals (MULTIVITAMIN WITH MINERALS) tablet Take 1 tablet by mouth daily.  . Omega-3 Fatty Acids (FISH OIL) 1000 MG CAPS Take 2,000 mg by mouth daily.  Marland Kitchen omeprazole (PRILOSEC) 40 MG capsule Take 40 mg by mouth daily.   Marland Kitchen oxyCODONE-acetaminophen (PERCOCET) 7.5-325 MG tablet Take 1 tablet by mouth every 6 (six) hours as needed for moderate pain.   . Pitavastatin Calcium 1 MG TABS Take 0.5 tablets by mouth daily.   . vitamin B-12 (CYANOCOBALAMIN) 1000 MCG tablet Take 1,000 mcg by mouth daily.    Scheduled Meds: . buPROPion  150 mg Oral Daily  . Chlorhexidine Gluconate Cloth  6 each Topical Daily  . DULoxetine  60 mg  Oral BID  . heparin  5,000 Units Subcutaneous Q8H  . insulin aspart  0-6 Units Subcutaneous TID WC  . modafinil  300 mg Oral Daily  . pantoprazole  40 mg Oral Daily  . pravastatin  10 mg Oral q1800   Continuous Infusions: . sodium chloride 125 mL/hr at 08/20/19 1827  . sodium chloride Stopped (08/20/19 1826)  . sodium chloride 125 mL/hr at 08/21/19 0550  . cefTRIAXone (ROCEPHIN)  IV Stopped (08/20/19 2047)   PRN Meds:.albuterol, ondansetron **OR** ondansetron (ZOFRAN) IV, oxyCODONE-acetaminophen  Allergies:  Allergies  Allergen Reactions  . Amoxicillin-Pot Clavulanate Other (See Comments)    Other Reaction: Other reaction  . Rosuvastatin Other (See Comments)  . Cephalexin Diarrhea  . Latex     Blisters     Family History  Problem Relation Age of Onset  . Colon polyps Mother   . Colon cancer Neg Hx   . Esophageal cancer Neg Hx   . Rectal cancer Neg Hx   . Stomach cancer Neg Hx     Social History:  reports that she has quit smoking. She has never used smokeless tobacco. She reports that she does not drink alcohol or use drugs.  ROS: A complete review of systems was performed.  All systems are negative except for pertinent findings as noted.  Physical Exam:  Vital signs in last 24 hours: Temp:  [97 F (36.1 C)-100.4 F (38 C)] 97 F (36.1 C) (03/08 2300) Pulse Rate:  [71-116] 72 (03/08 2130) Resp:  [14-27] 26 (03/09 0600) BP: (71-136)/(30-72) 136/58 (03/09 0600) SpO2:  [93 %-99 %] 93 % (03/08 2130) Weight:  [112 kg-117.8 kg] 117.8 kg (03/08 2300) Constitutional:  Alert and oriented, No acute distress Cardiovascular: Regular rate and rhythm, No JVD Respiratory: Normal respiratory effort, Lungs clear bilaterally GI: Abdomen is soft, nontender, nondistended, no abdominal masses GU: No CVA tenderness Lymphatic: No lymphadenopathy Neurologic: Grossly intact, no focal deficits Psychiatric: Normal mood and affect  Laboratory Data:  Recent Labs    08/20/19 1530   WBC 13.9*  HGB 12.0  HCT 35.2*  PLT 29*    Recent Labs    08/20/19 1530  NA 125*  K 3.2*  CL 94*  GLUCOSE 155*  BUN 33*  CALCIUM 8.2*  CREATININE 1.36*     Results for orders placed or performed during the hospital encounter of 08/20/19 (from the past 24 hour(s))  Comprehensive metabolic panel     Status: Abnormal   Collection Time: 08/20/19  3:30 PM  Result Value Ref Range   Sodium 125 (L) 135 - 145 mmol/L   Potassium 3.2 (L) 3.5 - 5.1 mmol/L   Chloride  94 (L) 98 - 111 mmol/L   CO2 20 (L) 22 - 32 mmol/L   Glucose, Bld 155 (H) 70 - 99 mg/dL   BUN 33 (H) 8 - 23 mg/dL   Creatinine, Ser 1.36 (H) 0.44 - 1.00 mg/dL   Calcium 8.2 (L) 8.9 - 10.3 mg/dL   Total Protein 6.1 (L) 6.5 - 8.1 g/dL   Albumin 2.6 (L) 3.5 - 5.0 g/dL   AST 41 15 - 41 U/L   ALT 27 0 - 44 U/L   Alkaline Phosphatase 273 (H) 38 - 126 U/L   Total Bilirubin 2.2 (H) 0.3 - 1.2 mg/dL   GFR calc non Af Amer 41 (L) >60 mL/min   GFR calc Af Amer 47 (L) >60 mL/min   Anion gap 11 5 - 15  Lactic acid, plasma     Status: None   Collection Time: 08/20/19  3:30 PM  Result Value Ref Range   Lactic Acid, Venous 1.6 0.5 - 1.9 mmol/L  Ethanol     Status: None   Collection Time: 08/20/19  3:30 PM  Result Value Ref Range   Alcohol, Ethyl (B) <10 <10 mg/dL  CBC WITH DIFFERENTIAL     Status: Abnormal   Collection Time: 08/20/19  3:30 PM  Result Value Ref Range   WBC 13.9 (H) 4.0 - 10.5 K/uL   RBC 4.14 3.87 - 5.11 MIL/uL   Hemoglobin 12.0 12.0 - 15.0 g/dL   HCT 35.2 (L) 36.0 - 46.0 %   MCV 85.0 80.0 - 100.0 fL   MCH 29.0 26.0 - 34.0 pg   MCHC 34.1 30.0 - 36.0 g/dL   RDW 13.3 11.5 - 15.5 %   Platelets 29 (LL) 150 - 400 K/uL   nRBC 0.0 0.0 - 0.2 %   Neutrophils Relative % 82 %   Neutro Abs 11.4 (H) 1.7 - 7.7 K/uL   Lymphocytes Relative 4 %   Lymphs Abs 0.5 (L) 0.7 - 4.0 K/uL   Monocytes Relative 6 %   Monocytes Absolute 0.9 0.1 - 1.0 K/uL   Eosinophils Relative 0 %   Eosinophils Absolute 0.0 0.0 - 0.5 K/uL    Basophils Relative 1 %   Basophils Absolute 0.1 0.0 - 0.1 K/uL   Immature Granulocytes 7 %   Abs Immature Granulocytes 0.99 (H) 0.00 - 0.07 K/uL  POC SARS Coronavirus 2 Ag-ED - Nasal Swab (BD Veritor Kit)     Status: None   Collection Time: 08/20/19  4:21 PM  Result Value Ref Range   SARS Coronavirus 2 Ag NEGATIVE NEGATIVE  Urine rapid drug screen (hosp performed)     Status: None   Collection Time: 08/20/19  6:15 PM  Result Value Ref Range   Opiates NONE DETECTED NONE DETECTED   Cocaine NONE DETECTED NONE DETECTED   Benzodiazepines NONE DETECTED NONE DETECTED   Amphetamines NONE DETECTED NONE DETECTED   Tetrahydrocannabinol NONE DETECTED NONE DETECTED   Barbiturates NONE DETECTED NONE DETECTED  Urinalysis, Routine w reflex microscopic     Status: Abnormal   Collection Time: 08/20/19  6:15 PM  Result Value Ref Range   Color, Urine YELLOW YELLOW   APPearance CLOUDY (A) CLEAR   Specific Gravity, Urine 1.012 1.005 - 1.030   pH 6.0 5.0 - 8.0   Glucose, UA NEGATIVE NEGATIVE mg/dL   Hgb urine dipstick MODERATE (A) NEGATIVE   Bilirubin Urine NEGATIVE NEGATIVE   Ketones, ur NEGATIVE NEGATIVE mg/dL   Protein, ur 30 (A) NEGATIVE mg/dL   Nitrite  NEGATIVE NEGATIVE   Leukocytes,Ua LARGE (A) NEGATIVE   RBC / HPF 0-5 0 - 5 RBC/hpf   WBC, UA >50 (H) 0 - 5 WBC/hpf   Bacteria, UA RARE (A) NONE SEEN   Squamous Epithelial / LPF 0-5 0 - 5   Mucus PRESENT   MRSA PCR Screening     Status: None   Collection Time: 08/20/19 11:56 PM   Specimen: Nasal Mucosa; Nasopharyngeal  Result Value Ref Range   MRSA by PCR NEGATIVE NEGATIVE   Recent Results (from the past 240 hour(s))  MRSA PCR Screening     Status: None   Collection Time: 08/20/19 11:56 PM   Specimen: Nasal Mucosa; Nasopharyngeal  Result Value Ref Range Status   MRSA by PCR NEGATIVE NEGATIVE Final    Comment:        The GeneXpert MRSA Assay (FDA approved for NASAL specimens only), is one component of a comprehensive MRSA  colonization surveillance program. It is not intended to diagnose MRSA infection nor to guide or monitor treatment for MRSA infections. Performed at [facility], [place] [address] [place],  [address]     Renal Function: Recent Labs    08/20/19 1530  CREATININE 1.36*   Estimated Creatinine Clearance: 48.4 mL/min (A) (by C-G formula based on SCr of 1.36 mg/dL (H)).  Radiologic Imaging: CT ABDOMEN PELVIS WO CONTRAST  Result Date: 08/20/2019 CLINICAL DATA:  Generalized weakness and abdominal pain. Concern for diverticulitis. EXAM: CT ABDOMEN AND PELVIS WITHOUT CONTRAST TECHNIQUE: Multidetector CT imaging of the abdomen and pelvis was performed following the standard protocol without IV contrast. Automatic exposure control utilized. COMPARISON:  None. FINDINGS: Lower chest: Borderline cardiomegaly. Mild left circumflex coronary calcification. Small hiatal hernia. Mild subpleural atelectasis in the lingula and dependent lungs. No dependent pleural effusion. Hepatobiliary: A number of small gallstones layering in the dependent gallbladder lumen in the neck region. Gallbladder hydrops measuring 5.7 cm diameter in the fundus and body. No apparent gallbladder wall thickening or para cholecystic fluid or edema. Normal biliary tree and common bile duct calibers. Pancreas: Mild atrophy and fatty infiltration. No acute pancreatitis or apparent main pancreatic duct dilatation. Spleen: Normal. Adrenals/Urinary Tract: Mild diffuse thickening of the left adrenal gland and an 8 mm 14 mm left adrenal benign adenoma. Normal right adrenal gland. An obstructing 5 mm calculus in the left distal ureter with moderate left hydroureteronephrosis. Asymmetrically increased left renal size and left perinephric and periureteral inflammatory change which could be postobstructive or infectious. A residual punctate nonobstructing micro calcification in the left midpole. No right hydroureteronephrosis or  nephroureterolithiasis. Mild right perinephric inflammatory change. No apparent abnormality of the urinary bladder. Stomach/Bowel: Remote gastric bypass. Vascular/Lymphatic: Abnormally clustered small and mildly enlarged periportal and retroperitoneal noncalcified lymph nodes measuring up to 17 by 19 mm. Reproductive: Normally sized uterus. No adnexal cyst or mass. Other: Moderate skeletal degenerative changes. Musculoskeletal: Chronic-appearing right hamstring partial tendinous avulsion with muscle atrophy. Grade 2 anterolisthesis of L5 on S1 with solid osseous fusion at the disc space and L5 bilateral pars interarticularis defects. Grade 1 retrolistheses at L3 on L4, L2 on L3, and L1 on L2 that are likely degenerative. IMPRESSION: 1. Obstructing 5 mm calculus in the left distal ureter with moderate left hydroureteronephrosis. There is asymmetric left renal increased size and left perinephric/periureteral inflammatory change, potentially postobstructive or infectious. Correlation with urinalysis may be useful. 2. Abnormally clustered small and mildly enlarged periportal and retroperitoneal lymph nodes, potentially reactive. Recommend clinical exclusion of an underlying  lymphoma. 3. Cholelithiasis and gallbladder hydrops without evidence of acute gallbladder wall thickening or adjacent inflammatory change or fluid. 4. Benign left adrenal adenoma. 5. Remote gastric bypass. 6. Grade 2 anterolisthesis of L5 on S1 with solid osseous fusion and bilateral pars interarticularis defects. Grade 1 retrolistheses at L3 on L4, L2 on L3, and L1 on L2 that are likely degenerative. 7. Chronic-appearing right hamstring partial tendinous avulsion with muscle atrophy. Electronically Signed   By: Revonda Humphrey   On: 08/20/2019 18:52   CT HEAD WO CONTRAST  Result Date: 08/20/2019 CLINICAL DATA:  Neuro deficit for several hours, weakness EXAM: CT HEAD WITHOUT CONTRAST TECHNIQUE: Contiguous axial images were obtained from the base of  the skull through the vertex without intravenous contrast. COMPARISON:  None. FINDINGS: Brain: No evidence of acute infarction, hemorrhage, hydrocephalus, extra-axial collection or mass lesion/mass effect. Vascular: No hyperdense vessel or unexpected calcification. Skull: Normal. Negative for fracture or focal lesion. Sinuses/Orbits: No acute finding. Other: None. IMPRESSION: No acute intracranial abnormality noted. Electronically Signed   By: Inez Catalina M.D.   On: 08/20/2019 15:52   DG Chest Port 1 View  Result Date: 08/20/2019 CLINICAL DATA:  Altered mental status EXAM: PORTABLE CHEST 1 VIEW COMPARISON:  June 26, 2015. FINDINGS: Lungs are clear. Heart size and pulmonary vascularity are normal. No adenopathy. No bone lesions. IMPRESSION: No edema or consolidation.  Heart size normal.  No adenopathy. Electronically Signed   By: Lowella Grip III M.D.   On: 08/20/2019 15:25    I independently reviewed the above imaging studies.  Impression/Recommendation:  65 year old female with a 5 mm left distal ureteral calculus resulting in moderate hydronephrosis, AKI and UTI.  -The risks, benefits and alternatives of cystoscopy with LEFT ureteroscopy, laser lithotripsy and ureteral stent placement was discussed the patient.  Risks included, but are not limited to: bleeding, urinary tract infection, ureteral injury/avulsion, ureteral stricture formation, retained stone fragments, the possibility that multiple surgeries may be required to treat the stone(s), MI, stroke, PE and the inherent risks of general anesthesia.  The patient voices understanding and wishes to proceed.    -Urine C&S pending.  Continue empiric rocephin  -Continue IVF resuscitation   Ellison Hughs, MD [facility] 08/21/2019, 7:18 AM

## 2019-08-21 NOTE — Progress Notes (Signed)
PHARMACY - PHYSICIAN COMMUNICATION CRITICAL VALUE ALERT - BLOOD CULTURE IDENTIFICATION (BCID)  Cristina Hansen is an 64 y.o. female who presented to [facility] on 08/20/2019 with a chief complaint of weakness  Assessment: E coli in 3/4 BCx bottles (suspect urinary source)  Name of physician (or Provider) Contacted: [facility]  Current antibiotics: Rocephin 2g IV q24 hr  Changes to prescribed antibiotics recommended:  Patient is on recommended antibiotics - No changes needed  Results for orders placed or performed during the hospital encounter of 08/20/19  Blood Culture ID Panel (Reflexed) (Collected: 08/20/2019  7:25 PM)  Result Value Ref Range   Enterococcus species NOT DETECTED NOT DETECTED   Listeria monocytogenes NOT DETECTED NOT DETECTED   Staphylococcus species NOT DETECTED NOT DETECTED   Staphylococcus aureus (BCID) NOT DETECTED NOT DETECTED   Streptococcus species NOT DETECTED NOT DETECTED   Streptococcus agalactiae NOT DETECTED NOT DETECTED   Streptococcus pneumoniae NOT DETECTED NOT DETECTED   Streptococcus pyogenes NOT DETECTED NOT DETECTED   Acinetobacter baumannii NOT DETECTED NOT DETECTED   Enterobacteriaceae species DETECTED (A) NOT DETECTED   Enterobacter cloacae complex NOT DETECTED NOT DETECTED   Escherichia coli DETECTED (A) NOT DETECTED   Klebsiella oxytoca NOT DETECTED NOT DETECTED   Klebsiella pneumoniae NOT DETECTED NOT DETECTED   Proteus species NOT DETECTED NOT DETECTED   Serratia marcescens NOT DETECTED NOT DETECTED   Carbapenem resistance NOT DETECTED NOT DETECTED   Haemophilus influenzae NOT DETECTED NOT DETECTED   Neisseria meningitidis NOT DETECTED NOT DETECTED   Pseudomonas aeruginosa NOT DETECTED NOT DETECTED   Candida albicans NOT DETECTED NOT DETECTED   Candida glabrata NOT DETECTED NOT DETECTED   Candida krusei NOT DETECTED NOT DETECTED   Candida parapsilosis NOT DETECTED NOT DETECTED   Candida tropicalis NOT DETECTED NOT DETECTED    Cristina Hansen,  [name] A 08/21/2019  3:09 PM

## 2019-08-22 DIAGNOSIS — K2100 Gastro-esophageal reflux disease with esophagitis, without bleeding: Secondary | ICD-10-CM

## 2019-08-22 DIAGNOSIS — G4733 Obstructive sleep apnea (adult) (pediatric): Secondary | ICD-10-CM

## 2019-08-22 LAB — CBC
HCT: 34.6 % — ABNORMAL LOW (ref 36.0–46.0)
Hemoglobin: 11.1 g/dL — ABNORMAL LOW (ref 12.0–15.0)
MCH: 28.8 pg (ref 26.0–34.0)
MCHC: 32.1 g/dL (ref 30.0–36.0)
MCV: 89.6 fL (ref 80.0–100.0)
Platelets: 51 10*3/uL — ABNORMAL LOW (ref 150–400)
RBC: 3.86 MIL/uL — ABNORMAL LOW (ref 3.87–5.11)
RDW: 14.4 % (ref 11.5–15.5)
WBC: 16.8 10*3/uL — ABNORMAL HIGH (ref 4.0–10.5)
nRBC: 0 % (ref 0.0–0.2)

## 2019-08-22 LAB — BASIC METABOLIC PANEL
Anion gap: 9 (ref 5–15)
BUN: 28 mg/dL — ABNORMAL HIGH (ref 8–23)
CO2: 20 mmol/L — ABNORMAL LOW (ref 22–32)
Calcium: 8.1 mg/dL — ABNORMAL LOW (ref 8.9–10.3)
Chloride: 108 mmol/L (ref 98–111)
Creatinine, Ser: 1.14 mg/dL — ABNORMAL HIGH (ref 0.44–1.00)
GFR calc Af Amer: 58 mL/min — ABNORMAL LOW (ref >60)
GFR calc non Af Amer: 50 mL/min — ABNORMAL LOW (ref >60)
Glucose, Bld: 137 mg/dL — ABNORMAL HIGH (ref 70–99)
Potassium: 4.2 mmol/L (ref 3.5–5.1)
Sodium: 137 mmol/L (ref 135–145)

## 2019-08-22 LAB — GLUCOSE, CAPILLARY
Glucose-Capillary: 112 mg/dL — ABNORMAL HIGH (ref 70–99)
Glucose-Capillary: 101 mg/dL — ABNORMAL HIGH (ref 70–99)
Glucose-Capillary: 128 mg/dL — ABNORMAL HIGH (ref 70–99)
Glucose-Capillary: 123 mg/dL — ABNORMAL HIGH (ref 70–99)

## 2019-08-22 NOTE — Progress Notes (Signed)
Received pt from ICU stepdown, Pt instable condition, NO acute changes. VS noted. SRP<RN

## 2019-08-22 NOTE — Evaluation (Signed)
Physical Therapy Evaluation Patient Details Name: Cristina Hansen MRN: [id number] DOB: Dec 01, 1955 Today's Date: 08/22/2019   History of Present Illness  This is a 64 year old female with past medical history of OSA, hyperlipidemia, GERD, diabetes, depression, gastric bypass, asthma who presented to [facility] after having increased urinary frequency, abdominal discomfort and found to have SIRS versus mild sepsis and Found to have 5 mm left distal ureteral calculus with moderate hydronephrosis on CT.Marland KitchenUrology was consulted and patient underwent cystoscopy with left ureteroscopy, lithotripsy and left JJ stent placement on 3/9  Clinical Impression  The patient is vwery motivated to mobilize. Patient  Did have better balance with RW today x 20' then x 50'. Generally uses a cane PRN. Patient should progress to DC home. No DME needs at this time. Pt admitted with above diagnosis.  Pt currently with functional limitations due to the deficits listed below (see PT Problem List). Pt will benefit from skilled PT to increase their independence and safety with mobility to allow discharge to the venue listed below.       Follow Up Recommendations No PT follow up    Equipment Recommendations  None recommended by PT    Recommendations for Other Services       Precautions / Restrictions Precautions Precautions: Fall      Mobility  Bed Mobility Overal bed mobility: Modified Independent             General bed mobility comments: HOB raised  Transfers Overall transfer level: Needs assistance Equipment used: Straight cane Transfers: Sit to/from Stand Sit to Stand: Min assist         General transfer comment: steady assist from bed, superision from toilet with use of rail  Ambulation/Gait Ambulation/Gait assistance: Min guard;Min assist Gait Distance (Feet): 20 Feet Assistive device: Rolling walker (2 wheeled);Straight cane Gait Pattern/deviations: Step-to pattern;Step-through pattern;Wide base  of support;Decreased stride length Gait velocity: decr   General Gait Details: with SPC, required min assist, with Rw only min guard.  Stairs            Wheelchair Mobility    Modified Rankin (Stroke Patients Only)       Balance Overall balance assessment: Needs assistance Sitting-balance support: No upper extremity supported;Feet supported Sitting balance-Leahy Scale: Good     Standing balance support: Single extremity supported;During functional activity Standing balance-Leahy Scale: Fair Standing balance comment: performed bath while tanding, holding with 1 UE                             Pertinent Vitals/Pain Pain Assessment: Faces Faces Pain Scale: Hurts a little bit Pain Location: loer  abdomen Pain Descriptors / Indicators: Discomfort Pain Intervention(s): Monitored during session;Premedicated before session    Home Living Family/patient expects to be discharged to:: Private residence Living Arrangements: Alone Available Help at Discharge: Family;Available PRN/intermittently Type of Home: Mobile home Home Access: Stairs to enter Entrance Stairs-Rails: Right Entrance Stairs-Number of Steps: 3 Home Layout: One level Home Equipment: Walker - 2 wheels;Cane - single point;Shower seat - built in      Prior Function Level of Independence: Independent with assistive device(s)         Comments: drives     Hand Dominance   Dominant Hand: Right    Extremity/Trunk Assessment   Upper Extremity Assessment Upper Extremity Assessment: Overall WFL for tasks assessed    Lower Extremity Assessment Lower Extremity Assessment: Generalized weakness    Cervical / Trunk Assessment Cervical /  Trunk Assessment: Normal  Communication   Communication: No difficulties  Cognition Arousal/Alertness: Awake/alert Behavior During Therapy: WFL for tasks assessed/performed Overall Cognitive Status: Within Functional Limits for tasks assessed                                         General Comments      Exercises     Assessment/Plan    PT Assessment Patient needs continued PT services  PT Problem List Decreased strength;Decreased activity tolerance       PT Treatment Interventions DME instruction;Functional mobility training;Gait training;Therapeutic activities;Therapeutic exercise    PT Goals (Current goals can be found in the Care Plan section)  Acute Rehab PT Goals Patient Stated Goal: to go home PT Goal Formulation: With patient Time For Goal Achievement: 09/05/19 Potential to Achieve Goals: Good    Frequency Min 3X/week   Barriers to discharge        Co-evaluation               AM-PAC PT "6 Clicks" Mobility  Outcome Measure Help needed turning from your back to your side while in a flat bed without using bedrails?: None Help needed moving from lying on your back to sitting on the side of a flat bed without using bedrails?: None Help needed moving to and from a bed to a chair (including a wheelchair)?: A Little Help needed standing up from a chair using your arms (e.g., wheelchair or bedside chair)?: A Little Help needed to walk in hospital room?: A Little Help needed climbing 3-5 steps with a railing? : A Little 6 Click Score: 20    End of Session   Activity Tolerance: Patient tolerated treatment well Patient left: in chair;with call bell/phone within reach Nurse Communication: Mobility status PT Visit Diagnosis: Difficulty in walking, not elsewhere classified (R26.2);Muscle weakness (generalized) (M62.81)    Time: 1000-1030 PT Time Calculation (min) (ACUTE ONLY): 30 min   Charges:   PT Evaluation $PT Eval Low Complexity: 1 Low PT Treatments $Gait Training: 8-22 mins        Tresa Endo PT Acute Rehabilitation Services Pager [phone] Office [phone]   Claretha Cooper 08/22/2019, 12:41 PM

## 2019-08-22 NOTE — Progress Notes (Signed)
PROGRESS NOTE  Cristina Hansen [id number] DOB: 17-Oct-1955 DOA: 08/20/2019 PCP: Jefm Petty, MD   LOS: 2 days   Brief narrative: As per HPI,  This is a 64 year old female with past medical history of OSA, hyperlipidemia, GERD, diabetes, depression, gastric bypass, asthma who presented to [facility] after having increased urinary frequency, abdominal discomfort and found to have SIRS versus mild sepsis. She was found to have 5 mm left distal ureteral calculus with moderate hydronephrosis on CT stone study and transferred [place] for further urologic work-up.  Started on ceftriaxone. Urology was consulted and patient underwent cystoscopy with left ureteroscopy, lithotripsy and left JJ stent placement on 3/9 with Dr. Lovena Neighbours.  She was also found to have E. coli bacteremia which resulted on 3/9.  She remained hemodynamically stable and was transferred from stepdown to med telemetry.  Assessment/Plan:  Active Problems:   Acid reflux   Diabetes mellitus with microalbuminuria (HCC)   Hyperlipidemia   Hypersomnolence   Morbid obesity with BMI of 40.0-44.9, adult (HCC)   OSA (obstructive sleep apnea)   Sepsis secondary to UTI ([place])   Nephrolithiasis   Acute pyelonephritis   Hyponatremia   AKI (acute kidney injury) ([place])   Essential hypertension   Chronic musculoskeletal pain   Sepsis secondary to E. coli bacteremia and left obstructive nephrolithiasis with hydronephrosis Continue IV Rocephin.  Sepsis has resolved at this time.  Still had persistent leukocytosis.  Patient will need to follow-up with urology as outpatient.  Obstructive 5 mm left UVJ stone with moderate hydroureteronephrosis s/p lithotripsy and JJ stent (with Dr. Lovena Neighbours, 08/21/2019).  Continue IV Rocephin.  Plan for stent removal in 1 week.  Follow urine culture blood culture.  Periportal/retroperitoneal lymphadenopathy. Potentially reactive however radiologist reports abnormally clustered and recommending clinical exclusion  of underlying lymphoma.  Will need follow-up as outpatient.  Benign left adrenal adenoma.  Incidental finding  Cholelithiasis without cholecystitis.  Asymptomatic at this time  Hyponatremia, resolved with IV fluids.  Closely monitor BMP.  Thrombocytopenia without signs of bleed, likely secondary to sepsis and improving.  Platelet count of 51 today.  AKI, Suspect multifactorial from sepsis and hydronephrosis. Baseline creatinine 0.8, creatinine of 1.1 today.  We will continue to monitor.  Hypokalemia. Improved with replacement.  Check BMP in a.m.  Asthma, stable. Continue albuterol  Essential hypertension Losartan on hold due to hypotension (has resolved) and AKI.  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 Chronic partial tenderness avulsion with muscle atrophy of right hamstring. Continue Percocet and Mobic  GERD on PPI  Hyperlipidemia on statin   VTE Prophylaxis: SCD, thrombocytopenia  Code Status: Full code  Family Communication: None today  Disposition Plan:  . Patient is from home . Likely disposition to home likely by tomorrow . Barriers to discharge: Culture and sensitivity pending, on antiplatelets   Consultants:  Urology  Procedures: 3/9 lithotripsy and JJ stent  Antibiotics:  . Rocephin IV  Anti-infectives (From admission, onward)   Start     Dose/Rate Route Frequency Ordered Stop   08/20/19 1945  vancomycin (VANCOCIN) IVPB 1000 mg/200 mL premix     1,000 mg 200 mL/hr over 60 Minutes Intravenous  Once 08/20/19 1942 08/21/19 0700   08/20/19 1930  cefTRIAXone (ROCEPHIN) 2 g in sodium chloride 0.9 % 100 mL IVPB     2  g 200 mL/hr over 30 Minutes Intravenous Every 24 hours 08/20/19 1923     08/20/19 1915  levofloxacin (LEVAQUIN) IVPB 750 mg  Status:  Discontinued     750 mg  100 mL/hr over 90 Minutes Intravenous  Once 08/20/19 1908 08/20/19 1923       Subjective: Today, patient was seen and examined at bedside.  Patient denies pain, nausea, vomiting or fever.  Complains of constipation.  Objective: Vitals:   08/22/19 1400 08/22/19 1500  BP:    Pulse: 83 82  Resp:  (!) 21  Temp:    SpO2:  100%    Intake/Output Summary (Last 24 hours) at 08/22/2019 1558 Last data filed at 08/22/2019 1300 Gross per 24 hour  Intake 3721.25 ml  Output -  Net 3721.25 ml   Filed Weights   08/20/19 1503 08/20/19 2300  Weight: 112 kg 117.8 kg   Body mass index is 50.72 kg/m.   Physical Exam: GENERAL: Patient is alert awake and oriented. Not in obvious distress.  Morbidly obese HENT: No scleral pallor or icterus. Pupils equally reactive to light. Oral mucosa is moist NECK: is supple, no gross swelling noted. CHEST: Clear to auscultation. No crackles or wheezes.  Diminished breath sounds bilaterally. CVS: S1 and S2 heard, no murmur. Regular rate and rhythm.  ABDOMEN: Soft, non-tender, bowel sounds are present. EXTREMITIES: No edema. CNS: Cranial nerves are intact. No focal motor deficits. SKIN: warm and dry without rashes.  Data Review: I have personally reviewed the following laboratory data and studies,  CBC: Recent Labs  Lab 08/20/19 1530 08/21/19 0909 08/22/19 0211  WBC 13.9* 16.0* 16.8*  NEUTROABS 11.4*  --   --   HGB 12.0 10.6* 11.1*  HCT 35.2* 32.2* 34.6*  MCV 85.0 87.5 89.6  PLT 29* 40* 51*   Basic Metabolic Panel: Recent Labs  Lab 08/20/19 1530 08/21/19 0909 08/22/19 0211  NA 125* 135 137  K 3.2* 3.4* 4.2  CL 94* 107 108  CO2 20* 20* 20*  GLUCOSE 155* 97 137*  BUN 33* 28* 28*  CREATININE 1.36* 1.01* 1.14*  CALCIUM 8.2* 7.8* 8.1*   Liver Function Tests: Recent Labs  Lab 08/20/19 1530  AST 41  ALT 27  ALKPHOS 273*  BILITOT 2.2*  PROT 6.1*  ALBUMIN 2.6*   No results for input(s): LIPASE, AMYLASE in the last 168 hours. No  results for input(s): AMMONIA in the last 168 hours. Cardiac Enzymes: No results for input(s): CKTOTAL, CKMB, CKMBINDEX, TROPONINI in the last 168 hours. BNP (last 3 results) No results for input(s): BNP in the last 8760 hours.  ProBNP (last 3 results) No results for input(s): PROBNP in the last 8760 hours.  CBG: Recent Labs  Lab 08/21/19 1249 08/21/19 1740 08/21/19 2227 08/22/19 0748 08/22/19 1137  GLUCAP 104* 164* 141* 112* 101*   Recent Results (from the past 240 hour(s))  SARS CORONAVIRUS 2 (TAT 6-24 HRS) Nasopharyngeal Nasopharyngeal Swab     Status: None   Collection Time: 08/20/19  6:15 PM   Specimen: Nasopharyngeal Swab  Result Value Ref Range Status   SARS Coronavirus 2 NEGATIVE NEGATIVE Final    Comment: (NOTE) SARS-CoV-2 target nucleic acids are NOT DETECTED. The SARS-CoV-2 RNA is generally detectable in upper and lower respiratory specimens during the acute phase of infection. Negative results do not preclude SARS-CoV-2 infection, do not rule out co-infections with other pathogens, and should not be used as the sole basis for treatment or other patient management decisions. Negative results must  be combined with clinical observations, patient history, and epidemiological information. The expected result is Negative. Fact Sheet for Patients: SugarRoll.be Fact Sheet for Healthcare Providers: https://www.woods-mathews.com/ This test is not yet approved or cleared by the [place] FDA and  has been authorized for detection and/or diagnosis of SARS-CoV-2 by FDA under an Emergency Use Authorization (EUA). This EUA will remain  in effect (meaning this test can be used) for the duration of the COVID-19 declaration under Section 56 4(b)(1) of the Act, 21 U.S.C. section 360bbb-3(b)(1), unless the authorization is terminated or revoked sooner. Performed at [facility], [place] [address] [place], [place] [address]    Culture, blood (x 2)     Status: Abnormal (Preliminary result)   Collection Time: 08/20/19  7:25 PM   Specimen: BLOOD  Result Value Ref Range Status   Specimen Description   Final    BLOOD RIGHT ANTECUBITAL Performed at [facility], [address], [place], [place] [address]    Special Requests   Final    BOTTLES DRAWN AEROBIC AND ANAEROBIC Blood Culture adequate volume Performed at [facility], [address], [place], [place] [address]    Culture  Setup Time   Final    GRAM NEGATIVE RODS IN BOTH AEROBIC AND ANAEROBIC BOTTLES Gram Stain Report Called to,Read Back By and Verified With: Ralph Leyden AT [facility] ICU AT [id number] ON [id number] BY THOMPSON S. CRITICAL RESULT CALLED TO, READ BACK BY AND VERIFIED WITHKeturah Barre [facility] PHARMD 1502 08/21/19 A BROWNING Performed at [facility], [place] [address], [place], [place] [address]    Culture ESCHERICHIA COLI (A)  Final   Report Status PENDING  Incomplete  Culture, blood (x 2)     Status: None (Preliminary result)   Collection Time: 08/20/19  7:25 PM   Specimen: BLOOD  Result Value Ref Range Status   Specimen Description   Final    BLOOD BLOOD RIGHT WRIST Performed at [facility], [address], [place], [place] [address]    Special Requests   Final    BOTTLES DRAWN AEROBIC AND ANAEROBIC Blood Culture adequate volume Performed at [facility], [address] [place], [place] [address]    Culture  Setup Time   Final    GRAM NEGATIVE RODS ANAEROBIC BOTTLE ONLY PREVIOUSLY CALLED OTHER SET TO MUELLER G. AT [place] [facility] AT [place] ON [id number] BY Biagio Borg AEROBIC BOTTLE ALSO Performed at [facility], [address] [place], [place] [address]    Culture GRAM NEGATIVE RODS  Final   Report Status PENDING  Incomplete  Blood Culture ID Panel (Reflexed)     Status: Abnormal   Collection Time: 08/20/19  7:25 PM  Result Value Ref Range Status   Enterococcus species NOT DETECTED NOT DETECTED Final   Listeria monocytogenes NOT DETECTED NOT DETECTED Final    Staphylococcus species NOT DETECTED NOT DETECTED Final   Staphylococcus aureus (BCID) NOT DETECTED NOT DETECTED Final   Streptococcus species NOT DETECTED NOT DETECTED Final   Streptococcus agalactiae NOT DETECTED NOT DETECTED Final   Streptococcus pneumoniae NOT DETECTED NOT DETECTED Final   Streptococcus pyogenes NOT DETECTED NOT DETECTED Final   Acinetobacter baumannii NOT DETECTED NOT DETECTED Final   Enterobacteriaceae species DETECTED (A) NOT DETECTED Final    Comment: Enterobacteriaceae represent a large family of gram-negative bacteria, not a single organism. CRITICAL RESULT CALLED TO, READ BACK BY AND VERIFIED WITH: D [facility] PHARMD 1502 08/21/19 A BROWNING    Enterobacter cloacae complex NOT DETECTED  NOT DETECTED Final   Escherichia coli DETECTED (A) NOT DETECTED Final    Comment: CRITICAL RESULT CALLED TO, READ BACK BY AND VERIFIED WITH: D [place] PHARMD 1502 08/21/19 A BROWNING    Klebsiella oxytoca NOT DETECTED NOT DETECTED Final   Klebsiella pneumoniae NOT DETECTED NOT DETECTED Final   Proteus species NOT DETECTED NOT DETECTED Final   Serratia marcescens NOT DETECTED NOT DETECTED Final   Carbapenem resistance NOT DETECTED NOT DETECTED Final   Haemophilus influenzae NOT DETECTED NOT DETECTED Final   Neisseria meningitidis NOT DETECTED NOT DETECTED Final   Pseudomonas aeruginosa NOT DETECTED NOT DETECTED Final   Candida albicans NOT DETECTED NOT DETECTED Final   Candida glabrata NOT DETECTED NOT DETECTED Final   Candida krusei NOT DETECTED NOT DETECTED Final   Candida parapsilosis NOT DETECTED NOT DETECTED Final   Candida tropicalis NOT DETECTED NOT DETECTED Final    Comment: Performed at [facility], [place] [address], [place], [place] [address]  MRSA PCR Screening     Status: None   Collection Time: 08/20/19 11:56 PM   Specimen: Nasal Mucosa; Nasopharyngeal  Result Value Ref Range Status   MRSA by PCR NEGATIVE NEGATIVE Final    Comment:        The GeneXpert MRSA  Assay (FDA approved for NASAL specimens only), is one component of a comprehensive MRSA colonization surveillance program. It is not intended to diagnose MRSA infection nor to guide or monitor treatment for MRSA infections. Performed at [facility], [place] [address], [place], [place] [address]   Respiratory Panel by RT PCR (Flu A&B, Covid) - Nasopharyngeal Swab     Status: None   Collection Time: 08/21/19  8:49 AM   Specimen: Nasopharyngeal Swab  Result Value Ref Range Status   SARS Coronavirus 2 by RT PCR NEGATIVE NEGATIVE Final    Comment: (NOTE) SARS-CoV-2 target nucleic acids are NOT DETECTED. The SARS-CoV-2 RNA is generally detectable in upper respiratoy specimens during the acute phase of infection. The lowest concentration of SARS-CoV-2 viral copies this assay can detect is 131 copies/mL. A negative result does not preclude SARS-Cov-2 infection and should not be used as the sole basis for treatment or other patient management decisions. A negative result may occur with  improper specimen collection/handling, submission of specimen other than nasopharyngeal swab, presence of viral mutation(s) within the areas targeted by this assay, and inadequate number of viral copies (<131 copies/mL). A negative result must be combined with clinical observations, patient history, and epidemiological information. The expected result is Negative. Fact Sheet for Patients:  PinkCheek.be Fact Sheet for Healthcare Providers:  GravelBags.it This test is not yet ap proved or cleared by the [place] FDA and  has been authorized for detection and/or diagnosis of SARS-CoV-2 by FDA under an Emergency Use Authorization (EUA). This EUA will remain  in effect (meaning this test can be used) for the duration of the COVID-19 declaration under Section 564(b)(1) of the Act, 21 U.S.C. section 360bbb-3(b)(1), unless the  authorization is terminated or revoked sooner.    Influenza A by PCR NEGATIVE NEGATIVE Final   Influenza B by PCR NEGATIVE NEGATIVE Final    Comment: (NOTE) The Xpert Xpress SARS-CoV-2/FLU/RSV assay is intended as an aid in  the diagnosis of influenza from Nasopharyngeal swab specimens and  should not be used as a sole basis for treatment. Nasal washings and  aspirates are unacceptable for Xpert Xpress SARS-CoV-2/FLU/RSV  testing. Fact Sheet for Patients: PinkCheek.be Fact Sheet for Healthcare  Providers: GravelBags.it This test is not yet approved or cleared by the [place] and  has been authorized for detection and/or diagnosis of SARS-CoV-2 by  FDA under an Emergency Use Authorization (EUA). This EUA will remain  in effect (meaning this test can be used) for the duration of the  Covid-19 declaration under Section 564(b)(1) of the Act, 21  U.S.C. section 360bbb-3(b)(1), unless the authorization is  terminated or revoked. Performed at [facility], [place] [address] [place], [place] [address]      Studies: CT ABDOMEN PELVIS WO CONTRAST  Result Date: 08/20/2019 CLINICAL DATA:  Generalized weakness and abdominal pain. Concern for diverticulitis. EXAM: CT ABDOMEN AND PELVIS WITHOUT CONTRAST TECHNIQUE: Multidetector CT imaging of the abdomen and pelvis was performed following the standard protocol without IV contrast. Automatic exposure control utilized. COMPARISON:  None. FINDINGS: Lower chest: Borderline cardiomegaly. Mild left circumflex coronary calcification. Small hiatal hernia. Mild subpleural atelectasis in the lingula and dependent lungs. No dependent pleural effusion. Hepatobiliary: A number of small gallstones layering in the dependent gallbladder lumen in the neck region. Gallbladder hydrops measuring 5.7 cm diameter in the fundus and body. No apparent gallbladder wall thickening or para  cholecystic fluid or edema. Normal biliary tree and common bile duct calibers. Pancreas: Mild atrophy and fatty infiltration. No acute pancreatitis or apparent main pancreatic duct dilatation. Spleen: Normal. Adrenals/Urinary Tract: Mild diffuse thickening of the left adrenal gland and an 8 mm 14 mm left adrenal benign adenoma. Normal right adrenal gland. An obstructing 5 mm calculus in the left distal ureter with moderate left hydroureteronephrosis. Asymmetrically increased left renal size and left perinephric and periureteral inflammatory change which could be postobstructive or infectious. A residual punctate nonobstructing micro calcification in the left midpole. No right hydroureteronephrosis or nephroureterolithiasis. Mild right perinephric inflammatory change. No apparent abnormality of the urinary bladder. Stomach/Bowel: Remote gastric bypass. Vascular/Lymphatic: Abnormally clustered small and mildly enlarged periportal and retroperitoneal noncalcified lymph nodes measuring up to 17 by 19 mm. Reproductive: Normally sized uterus. No adnexal cyst or mass. Other: Moderate skeletal degenerative changes. Musculoskeletal: Chronic-appearing right hamstring partial tendinous avulsion with muscle atrophy. Grade 2 anterolisthesis of L5 on S1 with solid osseous fusion at the disc space and L5 bilateral pars interarticularis defects. Grade 1 retrolistheses at L3 on L4, L2 on L3, and L1 on L2 that are likely degenerative. IMPRESSION: 1. Obstructing 5 mm calculus in the left distal ureter with moderate left hydroureteronephrosis. There is asymmetric left renal increased size and left perinephric/periureteral inflammatory change, potentially postobstructive or infectious. Correlation with urinalysis may be useful. 2. Abnormally clustered small and mildly enlarged periportal and retroperitoneal lymph nodes, potentially reactive. Recommend clinical exclusion of an underlying lymphoma. 3. Cholelithiasis and gallbladder  hydrops without evidence of acute gallbladder wall thickening or adjacent inflammatory change or fluid. 4. Benign left adrenal adenoma. 5. Remote gastric bypass. 6. Grade 2 anterolisthesis of L5 on S1 with solid osseous fusion and bilateral pars interarticularis defects. Grade 1 retrolistheses at L3 on L4, L2 on L3, and L1 on L2 that are likely degenerative. 7. Chronic-appearing right hamstring partial tendinous avulsion with muscle atrophy. Electronically Signed   By: Revonda Humphrey   On: 08/20/2019 18:52   DG C-Arm 1-60 Min-No Report  Result Date: 08/21/2019 Fluoroscopy was utilized by the requesting physician.  No radiographic interpretation.      Flora Lipps, MD  [facility] 08/22/2019

## 2019-08-23 LAB — COMPREHENSIVE METABOLIC PANEL
ALT: 34 U/L (ref 0–44)
AST: 33 U/L (ref 15–41)
Albumin: 2.3 g/dL — ABNORMAL LOW (ref 3.5–5.0)
Alkaline Phosphatase: 120 U/L (ref 38–126)
Anion gap: 9 (ref 5–15)
BUN: 22 mg/dL (ref 8–23)
CO2: 19 mmol/L — ABNORMAL LOW (ref 22–32)
Calcium: 8 mg/dL — ABNORMAL LOW (ref 8.9–10.3)
Chloride: 109 mmol/L (ref 98–111)
Creatinine, Ser: 0.85 mg/dL (ref 0.44–1.00)
GFR calc Af Amer: >60 mL/min (ref >60)
GFR calc non Af Amer: >60 mL/min (ref >60)
Glucose, Bld: 89 mg/dL (ref 70–99)
Potassium: 3.9 mmol/L (ref 3.5–5.1)
Sodium: 137 mmol/L (ref 135–145)
Total Bilirubin: 0.4 mg/dL (ref 0.3–1.2)
Total Protein: 5.4 g/dL — ABNORMAL LOW (ref 6.5–8.1)

## 2019-08-23 LAB — CBC
HCT: 37.9 % (ref 36.0–46.0)
Hemoglobin: 12 g/dL (ref 12.0–15.0)
MCH: 28.6 pg (ref 26.0–34.0)
MCHC: 31.7 g/dL (ref 30.0–36.0)
MCV: 90.2 fL (ref 80.0–100.0)
Platelets: 97 10*3/uL — ABNORMAL LOW (ref 150–400)
RBC: 4.2 MIL/uL (ref 3.87–5.11)
RDW: 14.6 % (ref 11.5–15.5)
WBC: 21.8 10*3/uL — ABNORMAL HIGH (ref 4.0–10.5)
nRBC: 0 % (ref 0.0–0.2)

## 2019-08-23 LAB — CULTURE, BLOOD (ROUTINE X 2)
Special Requests: ADEQUATE
Special Requests: ADEQUATE

## 2019-08-23 LAB — GLUCOSE, CAPILLARY
Glucose-Capillary: 81 mg/dL (ref 70–99)
Glucose-Capillary: 113 mg/dL — ABNORMAL HIGH (ref 70–99)

## 2019-08-23 LAB — MAGNESIUM: Magnesium: 2.1 mg/dL (ref 1.7–2.4)

## 2019-08-23 MED ORDER — CIPROFLOXACIN HCL 500 MG PO TABS: 500.0000 mg | ORAL_TABLET | Freq: Two times a day (BID) | ORAL | 0 refills | Status: AC

## 2019-08-23 MED ORDER — MAGNESIUM CITRATE PO SOLN
0.5000 | Freq: Once | ORAL | Status: AC
  Administered 2019-08-23: 0.5 via ORAL
  Filled 2019-08-23: qty 296

## 2019-08-23 MED ORDER — POLYETHYLENE GLYCOL 3350 17 G PO PACK: 17.0000 g | PACK | Freq: Every day | ORAL | 0 refills | Status: DC | PRN

## 2019-08-23 NOTE — Progress Notes (Signed)
2 Days Post-Op Subjective: Pain improved.  Afebrile for the past 24 hours.  No voiding complaints.  Feeling much better.  Blood cxs are growing E. Coli.  No urine cx results   Objective: Vital signs in last 24 hours: Temp:  [97.4 F (36.3 C)-97.8 F (36.6 C)] 97.5 F (36.4 C) (03/11 0630) Pulse Rate:  [73-94] 86 (03/11 0630) Resp:  [18-26] 19 (03/11 0630) BP: (119-163)/(44-71) 163/67 (03/11 0630) SpO2:  [93 %-100 %] 93 % (03/11 0630)  Intake/Output from previous day: 03/10 0701 - 03/11 0700 In: 2580 [P.O.:1080; I.V.:1500] Out: 550 [Urine:550]  Intake/Output this shift: No intake/output data recorded.  Physical Exam:  General: Alert and oriented  Lab Results: Recent Labs    08/21/19 0909 08/22/19 0211 08/23/19 0340  HGB 10.6* 11.1* 12.0  HCT 32.2* 34.6* 37.9   BMET Recent Labs    08/22/19 0211 08/23/19 0340  NA 137 137  K 4.2 3.9  CL 108 109  CO2 20* 19*  GLUCOSE 137* 89  BUN 28* 22  CREATININE 1.14* 0.85  CALCIUM 8.1* 8.0*     Studies/Results: DG C-Arm 1-60 Min-No Report  Result Date: 08/21/2019 Fluoroscopy was utilized by the requesting physician.  No radiographic interpretation.    Assessment/Plan: POD 1 s/p left URS/L/S E. Coli Bacteremia  -Sensitivities pending.  Currently on IV Rocephin.  Abx per primary team -f/u in one week for stent removal in the office.   LOS: 3 days   Ellison Hughs, MD [facility] Pager: [phone]  08/23/2019, 8:07 AM

## 2019-08-23 NOTE — Care Management Important Message (Signed)
Important Message  Patient Details IM Letter given to Gabriel Earing RN Case Manager to present to the Patient Name: Cristina Hansen MRN: [id number] Date of Birth: 01-28-1956   Medicare Important Message Given:  Yes     Kerin Salen 08/23/2019, 11:43 AM

## 2019-08-23 NOTE — Discharge Summary (Signed)
Physician Discharge Summary  Cristina Hansen [id number] DOB: 09/24/1955 DOA: 08/20/2019  PCP: Jefm Petty, MD  Admit date: 08/20/2019 Discharge date: 08/23/2019  Admitted From: Home  Discharge disposition: Home  Recommendations for Outpatient Follow-Up:   . Follow up with your primary care provider in one week.  CT scan of the abdomen showed some lymphadenopathy which will need to be monitored as outpatient with repeat CT scan.  This is however likely to be reactive.  . Check CBC, BMP in the next visit.  She did have a mild thrombocytopenia likely from sepsis which will need to be monitored.  . Follow-up with urology Dr. Gilford Rile in 1 week.  Discharge Diagnosis:   Active Problems:   Acid reflux   Diabetes mellitus with microalbuminuria (HCC)   Hyperlipidemia   Hypersomnolence   Morbid obesity with BMI of 40.0-44.9, adult (HCC)   OSA (obstructive sleep apnea)   Sepsis secondary to UTI ([place])   Nephrolithiasis   Acute pyelonephritis   Hyponatremia   AKI (acute kidney injury) ([place])   Essential hypertension   Chronic musculoskeletal pain   Discharge Condition: Improved.  Diet recommendation: Low sodium, heart healthy.  Carbohydrate controlled  Wound care: None.  Code status: Full.   History of Present Illness:   This is a 64 year old female with past medical history of OSA, hyperlipidemia, GERD, diabetes, depression, gastric bypass, asthma who presented to [facility] after having increased urinary frequency, abdominal discomfort and found to have SIRS versus mild sepsis. She was found to have 5 mm left distal ureteral calculus with moderate hydronephrosis on CT stone studyand transferred [facility] for further urologic work-up. Started on ceftriaxone. Urology was consulted and patient underwent cystoscopy with left ureteroscopy, lithotripsy and left JJ stent placement on 3/9 with Dr. Lovena Neighbours. She was also found to have E. coli bacteremia which resulted on 3/9. She  remained hemodynamically stable and was transferred from stepdown to med telemetry.   Hospital Course:   Following conditions were addressed during hospitalization as listed below,  Sepsis secondary to E. coli bacteremia and left obstructive nephrolithiasis with hydronephrosis Patient received IV Rocephin hospitalization.  Urine culture and sensitivity showed sensitivity to multiple antibiotics including ciprofloxacin.  We will continue ciprofloxacin for next 10 days to complete the course..  Sepsis has resolved at this time.   Patient will need to follow-up with urology as outpatient in 1 week..  Obstructive 5 mm left UVJ stone with moderate hydroureteronephrosis s/p lithotripsy and JJ stent (with Dr. Lovena Neighbours, 08/21/2019).   Plan for stent removal in 1 week.  Blood cultures with E. Coli.  Periportal/retroperitoneal lymphadenopathy. Potentially reactive, however radiologist reports abnormally clustered and recommending clinical exclusion of underlying lymphoma.  Will need follow-up as outpatient.  Benign left adrenal adenoma.  Incidental finding  Cholelithiasis without cholecystitis.  Asymptomatic at this time  Hyponatremia, resolved with IV fluids.  Closely monitor BMP.  Sodium prior to discharge was 137.  Thrombocytopenia without signs of bleed, likely secondary to sepsis. Platelet count improving.  Platelet count of 97,000 on discharge.  AKI,Suspect multifactorial from sepsis and hydronephrosis. Baseline creatinine 0.8, creatinine of 0.8 today.  Resolved  Hypokalemia. Improved with replacement.    Potassium of 3.9 prior to discharge.  Asthma, stable. Continue albuterol inhalers  Essential hypertension Will be resumed on losartan on discharge.  Depression on Cymbalta and Wellbutrin  Chronic painL5 on S1 grade 2 anterior listhesis with bilateral pars interarticularis defects, grade 1 retrolisthesis on L3 on L4, L2 on L3 and L1 on  L2. Chronic partial tenderness avulsion  with muscle atrophy of right hamstring. Continue narcotics on discharge  GERD on PPI, will continue on discharge  Hyperlipidemia on statin  Disposition.  At this time, patient is stable for disposition home.  Patient will need to follow-up with urology as outpatient.  Medical Consultants:    Urology  Procedures:    08/21/19:  lithotripsy and JJ stent Subjective:   Today, patient feels okay.  Denies any abdominal pain, nausea, vomiting fever or chills.  Discharge Exam:   Vitals:   08/23/19 0630 08/23/19 0916  BP: (!) 163/67 (!) 155/78  Pulse: 86 85  Resp: 19 18  Temp: (!) 97.5 F (36.4 C)   SpO2: 93% 96%   Vitals:   08/22/19 1733 08/22/19 2134 08/23/19 0630 08/23/19 0916  BP: 119/66 (!) 121/44 (!) 163/67 (!) 155/78  Pulse: 76 80 86 85  Resp: 18  19 18   Temp:  97.8 F (36.6 C) (!) 97.5 F (36.4 C)   TempSrc:      SpO2: 99% 95% 93% 96%  Weight:      Height:        General: Alert awake, not in obvious distress, morbidly obese HENT: pupils equally reacting to light,  No scleral pallor or icterus noted. Oral mucosa is moist.  Chest:  Clear breath sounds.  Diminished breath sounds bilaterally. No crackles or wheezes.  CVS: S1 &S2 heard. No murmur.  Regular rate and rhythm. Abdomen: Soft, nontender, nondistended.  Bowel sounds are heard.   Extremities: No cyanosis, clubbing or edema.  Peripheral pulses are palpable. Psych: Alert, awake and oriented, normal mood CNS:  No cranial nerve deficits.  Power equal in all extremities.   Skin: Warm and dry.  No rashes noted.  The results of significant diagnostics from this hospitalization (including imaging, microbiology, ancillary and laboratory) are listed below for reference.     Diagnostic Studies:   CT ABDOMEN PELVIS WO CONTRAST  Result Date: 08/20/2019 CLINICAL DATA:  Generalized weakness and abdominal pain. Concern for diverticulitis. EXAM: CT ABDOMEN AND PELVIS WITHOUT CONTRAST TECHNIQUE: Multidetector CT imaging  of the abdomen and pelvis was performed following the standard protocol without IV contrast. Automatic exposure control utilized. COMPARISON:  None. FINDINGS: Lower chest: Borderline cardiomegaly. Mild left circumflex coronary calcification. Small hiatal hernia. Mild subpleural atelectasis in the lingula and dependent lungs. No dependent pleural effusion. Hepatobiliary: A number of small gallstones layering in the dependent gallbladder lumen in the neck region. Gallbladder hydrops measuring 5.7 cm diameter in the fundus and body. No apparent gallbladder wall thickening or para cholecystic fluid or edema. Normal biliary tree and common bile duct calibers. Pancreas: Mild atrophy and fatty infiltration. No acute pancreatitis or apparent main pancreatic duct dilatation. Spleen: Normal. Adrenals/Urinary Tract: Mild diffuse thickening of the left adrenal gland and an 8 mm 14 mm left adrenal benign adenoma. Normal right adrenal gland. An obstructing 5 mm calculus in the left distal ureter with moderate left hydroureteronephrosis. Asymmetrically increased left renal size and left perinephric and periureteral inflammatory change which could be postobstructive or infectious. A residual punctate nonobstructing micro calcification in the left midpole. No right hydroureteronephrosis or nephroureterolithiasis. Mild right perinephric inflammatory change. No apparent abnormality of the urinary bladder. Stomach/Bowel: Remote gastric bypass. Vascular/Lymphatic: Abnormally clustered small and mildly enlarged periportal and retroperitoneal noncalcified lymph nodes measuring up to 17 by 19 mm. Reproductive: Normally sized uterus. No adnexal cyst or mass. Other: Moderate skeletal degenerative changes. Musculoskeletal: Chronic-appearing right hamstring partial tendinous avulsion with  muscle atrophy. Grade 2 anterolisthesis of L5 on S1 with solid osseous fusion at the disc space and L5 bilateral pars interarticularis defects. Grade 1  retrolistheses at L3 on L4, L2 on L3, and L1 on L2 that are likely degenerative. IMPRESSION: 1. Obstructing 5 mm calculus in the left distal ureter with moderate left hydroureteronephrosis. There is asymmetric left renal increased size and left perinephric/periureteral inflammatory change, potentially postobstructive or infectious. Correlation with urinalysis may be useful. 2. Abnormally clustered small and mildly enlarged periportal and retroperitoneal lymph nodes, potentially reactive. Recommend clinical exclusion of an underlying lymphoma. 3. Cholelithiasis and gallbladder hydrops without evidence of acute gallbladder wall thickening or adjacent inflammatory change or fluid. 4. Benign left adrenal adenoma. 5. Remote gastric bypass. 6. Grade 2 anterolisthesis of L5 on S1 with solid osseous fusion and bilateral pars interarticularis defects. Grade 1 retrolistheses at L3 on L4, L2 on L3, and L1 on L2 that are likely degenerative. 7. Chronic-appearing right hamstring partial tendinous avulsion with muscle atrophy. Electronically Signed   By: Revonda Humphrey   On: 08/20/2019 18:52   CT HEAD WO CONTRAST  Result Date: 08/20/2019 CLINICAL DATA:  Neuro deficit for several hours, weakness EXAM: CT HEAD WITHOUT CONTRAST TECHNIQUE: Contiguous axial images were obtained from the base of the skull through the vertex without intravenous contrast. COMPARISON:  None. FINDINGS: Brain: No evidence of acute infarction, hemorrhage, hydrocephalus, extra-axial collection or mass lesion/mass effect. Vascular: No hyperdense vessel or unexpected calcification. Skull: Normal. Negative for fracture or focal lesion. Sinuses/Orbits: No acute finding. Other: None. IMPRESSION: No acute intracranial abnormality noted. Electronically Signed   By: Inez Catalina M.D.   On: 08/20/2019 15:52   DG Chest Port 1 View  Result Date: 08/20/2019 CLINICAL DATA:  Altered mental status EXAM: PORTABLE CHEST 1 VIEW COMPARISON:  June 26, 2015. FINDINGS:  Lungs are clear. Heart size and pulmonary vascularity are normal. No adenopathy. No bone lesions. IMPRESSION: No edema or consolidation.  Heart size normal.  No adenopathy. Electronically Signed   By: Lowella Grip III M.D.   On: 08/20/2019 15:25   DG C-Arm 1-60 Min-No Report  Result Date: 08/21/2019 Fluoroscopy was utilized by the requesting physician.  No radiographic interpretation.     Labs:   Basic Metabolic Panel: Recent Labs  Lab 08/20/19 1530 08/20/19 1530 08/21/19 0909 08/21/19 0909 08/22/19 0211 08/23/19 0340  NA 125*  --  135  --  137 137  K 3.2*   < > 3.4*   < > 4.2 3.9  CL 94*  --  107  --  108 109  CO2 20*  --  20*  --  20* 19*  GLUCOSE 155*  --  97  --  137* 89  BUN 33*  --  28*  --  28* 22  CREATININE 1.36*  --  1.01*  --  1.14* 0.85  CALCIUM 8.2*  --  7.8*  --  8.1* 8.0*  MG  --   --   --   --   --  2.1   < > = values in this interval not displayed.   GFR Estimated Creatinine Clearance: 77.5 mL/min (by C-G formula based on SCr of 0.85 mg/dL). Liver Function Tests: Recent Labs  Lab 08/20/19 1530 08/23/19 0340  AST 41 33  ALT 27 34  ALKPHOS 273* 120  BILITOT 2.2* 0.4  PROT 6.1* 5.4*  ALBUMIN 2.6* 2.3*   No results for input(s): LIPASE, AMYLASE in the last 168 hours. No results for  input(s): AMMONIA in the last 168 hours. Coagulation profile No results for input(s): INR, PROTIME in the last 168 hours.  CBC: Recent Labs  Lab 08/20/19 1530 08/21/19 0909 08/22/19 0211 08/23/19 0340  WBC 13.9* 16.0* 16.8* 21.8*  NEUTROABS 11.4*  --   --   --   HGB 12.0 10.6* 11.1* 12.0  HCT 35.2* 32.2* 34.6* 37.9  MCV 85.0 87.5 89.6 90.2  PLT 29* 40* 51* 97*   Cardiac Enzymes: No results for input(s): CKTOTAL, CKMB, CKMBINDEX, TROPONINI in the last 168 hours. BNP: Invalid input(s): POCBNP CBG: Recent Labs  Lab 08/22/19 0748 08/22/19 1137 08/22/19 1627 08/22/19 2147 08/23/19 0805  GLUCAP 112* 101* 128* 123* 81   D-Dimer No results for input(s):  DDIMER in the last 72 hours. Hgb A1c Recent Labs    08/20/19 1530  HGBA1C 6.3*   Lipid Profile No results for input(s): CHOL, HDL, LDLCALC, TRIG, CHOLHDL, LDLDIRECT in the last 72 hours. Thyroid function studies No results for input(s): TSH, T4TOTAL, T3FREE, THYROIDAB in the last 72 hours.  Invalid input(s): FREET3 Anemia work up No results for input(s): VITAMINB12, FOLATE, FERRITIN, TIBC, IRON, RETICCTPCT in the last 72 hours. Microbiology Recent Results (from the past 240 hour(s))  SARS CORONAVIRUS 2 (TAT 6-24 HRS) Nasopharyngeal Nasopharyngeal Swab     Status: None   Collection Time: 08/20/19  6:15 PM   Specimen: Nasopharyngeal Swab  Result Value Ref Range Status   SARS Coronavirus 2 NEGATIVE NEGATIVE Final    Comment: (NOTE) SARS-CoV-2 target nucleic acids are NOT DETECTED. The SARS-CoV-2 RNA is generally detectable in upper and lower respiratory specimens during the acute phase of infection. Negative results do not preclude SARS-CoV-2 infection, do not rule out co-infections with other pathogens, and should not be used as the sole basis for treatment or other patient management decisions. Negative results must be combined with clinical observations, patient history, and epidemiological information. The expected result is Negative. Fact Sheet for Patients: SugarRoll.be Fact Sheet for Healthcare Providers: https://www.woods-mathews.com/ This test is not yet approved or cleared by the [place] FDA and  has been authorized for detection and/or diagnosis of SARS-CoV-2 by FDA under an Emergency Use Authorization (EUA). This EUA will remain  in effect (meaning this test can be used) for the duration of the COVID-19 declaration under Section 56 4(b)(1) of the Act, 21 U.S.C. section 360bbb-3(b)(1), unless the authorization is terminated or revoked sooner. Performed at [facility], [place] [address] [place], [place] [address]     Culture, blood (x 2)     Status: Abnormal   Collection Time: 08/20/19  7:25 PM   Specimen: BLOOD  Result Value Ref Range Status   Specimen Description   Final    BLOOD RIGHT ANTECUBITAL Performed at [facility], [address], [place], [place] [address]    Special Requests   Final    BOTTLES DRAWN AEROBIC AND ANAEROBIC Blood Culture adequate volume Performed at [facility], [address] [place], [place] [address]    Culture  Setup Time   Final    GRAM NEGATIVE RODS IN BOTH AEROBIC AND ANAEROBIC BOTTLES Gram Stain Report Called to,Read Back By and Verified With: Ralph Leyden AT [facility] ICU AT 0926A ON [id number] BY THOMPSON S. CRITICAL RESULT CALLED TO, READ BACK BY AND VERIFIED WITHKeturah Barre [facility] PHARMD 1502 08/21/19 A BROWNING Performed at [facility], [place] [address], [place], [place] [address]    Culture ESCHERICHIA COLI (A)  Final   Report  Status 08/23/2019 FINAL  Final   Organism ID, Bacteria ESCHERICHIA COLI  Final      Susceptibility   Escherichia coli - MIC*    AMPICILLIN 8 SENSITIVE Sensitive     CEFAZOLIN <=4 SENSITIVE Sensitive     CEFEPIME <=0.12 SENSITIVE Sensitive     CEFTAZIDIME <=1 SENSITIVE Sensitive     CEFTRIAXONE <=0.25 SENSITIVE Sensitive     CIPROFLOXACIN <=0.25 SENSITIVE Sensitive     GENTAMICIN <=1 SENSITIVE Sensitive     IMIPENEM <=0.25 SENSITIVE Sensitive     TRIMETH/SULFA <=20 SENSITIVE Sensitive     AMPICILLIN/SULBACTAM 4 SENSITIVE Sensitive     PIP/TAZO <=4 SENSITIVE Sensitive     * ESCHERICHIA COLI  Culture, blood (x 2)     Status: Abnormal   Collection Time: 08/20/19  7:25 PM   Specimen: BLOOD  Result Value Ref Range Status   Specimen Description   Final    BLOOD BLOOD RIGHT WRIST Performed at [facility], [address] [place], [place] [address]    Special Requests   Final    BOTTLES DRAWN AEROBIC AND ANAEROBIC Blood Culture adequate volume Performed at [facility], [address] [place], [place] [address]    Culture  Setup Time   Final     GRAM NEGATIVE RODS ANAEROBIC BOTTLE ONLY PREVIOUSLY CALLED OTHER SET TO MUELLER G. AT [facility] ICU AT 0926A ON [id number] BY Biagio Borg AEROBIC BOTTLE ALSO Performed at [facility], [address], [place], [place] [address]    Culture (A)  Final    ESCHERICHIA COLI SUSCEPTIBILITIES PERFORMED ON PREVIOUS CULTURE WITHIN THE LAST 5 DAYS. Performed at [facility], [place] [address], [place], [place] [address]    Report Status 08/23/2019 FINAL  Final  Blood Culture ID Panel (Reflexed)     Status: Abnormal   Collection Time: 08/20/19  7:25 PM  Result Value Ref Range Status   Enterococcus species NOT DETECTED NOT DETECTED Final   Listeria monocytogenes NOT DETECTED NOT DETECTED Final   Staphylococcus species NOT DETECTED NOT DETECTED Final   Staphylococcus aureus (BCID) NOT DETECTED NOT DETECTED Final   Streptococcus species NOT DETECTED NOT DETECTED Final   Streptococcus agalactiae NOT DETECTED NOT DETECTED Final   Streptococcus pneumoniae NOT DETECTED NOT DETECTED Final   Streptococcus pyogenes NOT DETECTED NOT DETECTED Final   Acinetobacter baumannii NOT DETECTED NOT DETECTED Final   Enterobacteriaceae species DETECTED (A) NOT DETECTED Final    Comment: Enterobacteriaceae represent a large family of gram-negative bacteria, not a single organism. CRITICAL RESULT CALLED TO, READ BACK BY AND VERIFIED WITH: D [facility] PHARMD 1502 08/21/19 A BROWNING    Enterobacter cloacae complex NOT DETECTED NOT DETECTED Final   Escherichia coli DETECTED (A) NOT DETECTED Final    Comment: CRITICAL RESULT CALLED TO, READ BACK BY AND VERIFIED WITH: D [facility] PHARMD 1502 08/21/19 A BROWNING    Klebsiella oxytoca NOT DETECTED NOT DETECTED Final   Klebsiella pneumoniae NOT DETECTED NOT DETECTED Final   Proteus species NOT DETECTED NOT DETECTED Final   Serratia marcescens NOT DETECTED NOT DETECTED Final   Carbapenem resistance NOT DETECTED NOT DETECTED Final   Haemophilus influenzae NOT DETECTED NOT DETECTED  Final   Neisseria meningitidis NOT DETECTED NOT DETECTED Final   Pseudomonas aeruginosa NOT DETECTED NOT DETECTED Final   Candida albicans NOT DETECTED NOT DETECTED Final   Candida glabrata NOT DETECTED NOT DETECTED Final   Candida krusei NOT DETECTED NOT DETECTED Final   Candida parapsilosis NOT DETECTED NOT DETECTED  Final   Candida tropicalis NOT DETECTED NOT DETECTED Final    Comment: Performed at [facility], [place] [address], [place], [place] [address]  MRSA PCR Screening     Status: None   Collection Time: 08/20/19 11:56 PM   Specimen: Nasal Mucosa; Nasopharyngeal  Result Value Ref Range Status   MRSA by PCR NEGATIVE NEGATIVE Final    Comment:        The GeneXpert MRSA Assay (FDA approved for NASAL specimens only), is one component of a comprehensive MRSA colonization surveillance program. It is not intended to diagnose MRSA infection nor to guide or monitor treatment for MRSA infections. Performed at [facility], [place] [address], [place], [place] [address]   Respiratory Panel by RT PCR (Flu A&B, Covid) - Nasopharyngeal Swab     Status: None   Collection Time: 08/21/19  8:49 AM   Specimen: Nasopharyngeal Swab  Result Value Ref Range Status   SARS Coronavirus 2 by RT PCR NEGATIVE NEGATIVE Final    Comment: (NOTE) SARS-CoV-2 target nucleic acids are NOT DETECTED. The SARS-CoV-2 RNA is generally detectable in upper respiratoy specimens during the acute phase of infection. The lowest concentration of SARS-CoV-2 viral copies this assay can detect is 131 copies/mL. A negative result does not preclude SARS-Cov-2 infection and should not be used as the sole basis for treatment or other patient management decisions. A negative result may occur with  improper specimen collection/handling, submission of specimen other than nasopharyngeal swab, presence of viral mutation(s) within the areas targeted by this assay, and inadequate number of viral copies (<131  copies/mL). A negative result must be combined with clinical observations, patient history, and epidemiological information. The expected result is Negative. Fact Sheet for Patients:  PinkCheek.be Fact Sheet for Healthcare Providers:  GravelBags.it This test is not yet ap proved or cleared by the [place] FDA and  has been authorized for detection and/or diagnosis of SARS-CoV-2 by FDA under an Emergency Use Authorization (EUA). This EUA will remain  in effect (meaning this test can be used) for the duration of the COVID-19 declaration under Section 564(b)(1) of the Act, 21 U.S.C. section 360bbb-3(b)(1), unless the authorization is terminated or revoked sooner.    Influenza A by PCR NEGATIVE NEGATIVE Final   Influenza B by PCR NEGATIVE NEGATIVE Final    Comment: (NOTE) The Xpert Xpress SARS-CoV-2/FLU/RSV assay is intended as an aid in  the diagnosis of influenza from Nasopharyngeal swab specimens and  should not be used as a sole basis for treatment. Nasal washings and  aspirates are unacceptable for Xpert Xpress SARS-CoV-2/FLU/RSV  testing. Fact Sheet for Patients: PinkCheek.be Fact Sheet for Healthcare Providers: GravelBags.it This test is not yet approved or cleared by the [place] FDA and  has been authorized for detection and/or diagnosis of SARS-CoV-2 by  FDA under an Emergency Use Authorization (EUA). This EUA will remain  in effect (meaning this test can be used) for the duration of the  Covid-19 declaration under Section 564(b)(1) of the Act, 21  U.S.C. section 360bbb-3(b)(1), unless the authorization is  terminated or revoked. Performed at [facility], [place] [address] [place], [place] [address]      Discharge Instructions:   Discharge Instructions    Diet - low sodium heart healthy   Complete by: As directed    Discharge  instructions   Complete by: As directed    Follow-up with your primary care physician in 1 week.  Follow-up with urology  in 1 week.  Complete the course of antibiotic.  Increase fluid intake.  Seek medical attention for worsening symptoms.   Increase activity slowly   Complete by: As directed      Allergies as of 08/23/2019      Reactions   Amoxicillin-pot Clavulanate Other (See Comments)   Other Reaction: Other reaction   Rosuvastatin Other (See Comments)   Cephalexin Diarrhea   Latex    Blisters      Medication List    STOP taking these medications   meloxicam 15 MG tablet Commonly known as: MOBIC   oxyCODONE-acetaminophen 7.5-325 MG tablet Commonly known as: PERCOCET     TAKE these medications   albuterol 108 (90 Base) MCG/ACT inhaler Commonly known as: VENTOLIN HFA Inhale 2 puffs into the lungs every 4 (four) hours as needed for wheezing or shortness of breath.   aspirin EC 81 MG tablet Take 81 mg by mouth daily.   b complex vitamins tablet Take 1 tablet by mouth daily.   buPROPion 150 MG 24 hr tablet Commonly known as: WELLBUTRIN XL Take 150 mg by mouth daily.   ciprofloxacin 500 MG tablet Commonly known as: Cipro Take 1 tablet (500 mg total) by mouth 2 (two) times daily for 10 days.   DULoxetine 60 MG capsule Commonly known as: CYMBALTA Take 60 mg by mouth 2 (two) times daily.   ferrous sulfate 325 (65 FE) MG tablet Take 325 mg by mouth daily with breakfast.   Fish Oil 1000 MG Caps Take 2,000 mg by mouth daily.   HYDROcodone-acetaminophen 5-325 MG tablet Commonly known as: Norco Take 1 tablet by mouth every 4 (four) hours as needed for moderate pain.   losartan 25 MG tablet Commonly known as: COZAAR Take 25 mg by mouth daily.   modafinil 200 MG tablet Commonly known as: PROVIGIL Take 300 mg by mouth daily.   multivitamin with minerals tablet Take 1 tablet by mouth daily.   omeprazole 40 MG capsule Commonly known as: PRILOSEC Take 40 mg by  mouth daily.   phenazopyridine 200 MG tablet Commonly known as: Pyridium Take 1 tablet (200 mg total) by mouth 3 (three) times daily as needed (for pain with urination).   Pitavastatin Calcium 1 MG Tabs Take 0.5 tablets by mouth daily.   polyethylene glycol 17 g packet Commonly known as: MIRALAX / GLYCOLAX Take 17 g by mouth daily as needed for moderate constipation.   vitamin B-12 1000 MCG tablet Commonly known as: CYANOCOBALAMIN Take 1,000 mcg by mouth daily.   Vitamin D3 125 MCG (5000 UT) Tabs Take 5,000 Units by mouth 2 (two) times daily.      Follow-up Information    [facility] In 1 week.   Why: Stent removal Contact information: [place]       Jefm Petty, MD. Schedule an appointment as soon as possible for a visit in 1 week(s).   Specialty: Family Medicine Why: for regular followup Contact information: [id number] [place] [address] [phone]            Time coordinating discharge: 39 minutes  Signed:  Zoran Yankee  [facility] 08/23/2019, 11:05 AM

## 2019-08-23 NOTE — Progress Notes (Signed)
Patient remains A&Ox4, ambulatory without assistance. Discharge instructions reviewed, questions concerns denied at this time.

## 2019-08-23 NOTE — Progress Notes (Signed)
Physical Therapy Treatment Patient Details Name: Cristina Hansen MRN: [id number] DOB: [birth year] Today's Date: 08/23/2019    History of Present Illness This is a 64 year old female with past medical history of OSA, hyperlipidemia, GERD, diabetes, depression, gastric bypass, asthma who presented to [facility] after having increased urinary frequency, abdominal discomfort and found to have SIRS versus mild sepsis and Found to have 5 mm left distal ureteral calculus with moderate hydronephrosis on CT.Marland KitchenUrology was consulted and patient underwent cystoscopy with left ureteroscopy, lithotripsy and left JJ stent placement on 3/9    PT Comments    Pt ambulated good distance in hallway with RW.  Pt hopeful for d/c home today.  Pt has SPC and RW at home.     Follow Up Recommendations  No PT follow up     Equipment Recommendations  None recommended by PT    Recommendations for Other Services       Precautions / Restrictions Precautions Precautions: Fall    Mobility  Bed Mobility Overal bed mobility: Modified Independent                Transfers Overall transfer level: Needs assistance Equipment used: Rolling walker (2 wheeled) Transfers: Sit to/from Stand Sit to Stand: Min guard         General transfer comment: verbal cues for hand placement  Ambulation/Gait Ambulation/Gait assistance: Min guard Gait Distance (Feet): 200 Feet Assistive device: Rolling walker (2 wheeled) Gait Pattern/deviations: Step-through pattern;Decreased stride length     General Gait Details: steady with RW, tolerated improved distance today   Stairs             Wheelchair Mobility    Modified Rankin (Stroke Patients Only)       Balance                                            Cognition Arousal/Alertness: Awake/alert Behavior During Therapy: WFL for tasks assessed/performed Overall Cognitive Status: Within Functional Limits for tasks assessed                                         Exercises      General Comments        Pertinent Vitals/Pain Pain Assessment: Faces Faces Pain Scale: Hurts a little bit Pain Location: back Pain Descriptors / Indicators: Discomfort Pain Intervention(s): Repositioned;Monitored during session    Home Living                      Prior Function            PT Goals (current goals can now be found in the care plan section) Progress towards PT goals: Progressing toward goals    Frequency    Min 3X/week      PT Plan Current plan remains appropriate    Co-evaluation              AM-PAC PT "6 Clicks" Mobility   Outcome Measure  Help needed turning from your back to your side while in a flat bed without using bedrails?: None Help needed moving from lying on your back to sitting on the side of a flat bed without using bedrails?: None Help needed moving to and from a bed to a chair (including a wheelchair)?: A  Little Help needed standing up from a chair using your arms (e.g., wheelchair or bedside chair)?: A Little Help needed to walk in hospital room?: A Little Help needed climbing 3-5 steps with a railing? : A Little 6 Click Score: 20    End of Session   Activity Tolerance: Patient tolerated treatment well Patient left: in bed;with call bell/phone within reach   PT Visit Diagnosis: Difficulty in walking, not elsewhere classified (R26.2)     Time: 1000-1019 PT Time Calculation (min) (ACUTE ONLY): 19 min  Charges:  $Gait Training: 8-22 mins                    Arlyce Dice, DPT Acute Rehabilitation Services Office: [place] 08/23/2019, 12:15 PM

## 2019-09-19 ENCOUNTER — Telehealth: Payer: Self-pay | Admitting: Gastroenterology

## 2019-09-19 NOTE — Telephone Encounter (Signed)
Patient saw her PCP and was told there was blood in her stool.  She just had a colonoscopy on 06/13/19.  She will come for follow up on 5/13.  Results and path faxed to [phone]

## 2019-09-19 NOTE — Telephone Encounter (Signed)
Patient calling- asking if the results from her colonoscopy can be sent to her doctor at [facility]. Dr. Jeralene Huff. She also states that Dr. Jeralene Huff wants her to have another colonoscopy because she has blood in her stool and she wanted to talk to you about it.

## 2019-10-26 ENCOUNTER — Encounter: Payer: Self-pay | Admitting: Gastroenterology

## 2019-10-26 ENCOUNTER — Ambulatory Visit: Payer: Medicare HMO | Admitting: Gastroenterology

## 2019-10-26 VITALS — BP 150/78 | HR 112 | Temp 98.2°F | Ht 60.0 in | Wt 235.0 lb

## 2019-10-26 DIAGNOSIS — R195 Other fecal abnormalities: Secondary | ICD-10-CM | POA: Diagnosis not present

## 2019-10-26 DIAGNOSIS — R933 Abnormal findings on diagnostic imaging of other parts of digestive tract: Secondary | ICD-10-CM | POA: Diagnosis not present

## 2019-10-26 DIAGNOSIS — Z860100 Personal history of colon polyps, unspecified: Secondary | ICD-10-CM

## 2019-10-26 DIAGNOSIS — R935 Abnormal findings on diagnostic imaging of other abdominal regions, including retroperitoneum: Secondary | ICD-10-CM | POA: Diagnosis not present

## 2019-10-26 NOTE — Patient Instructions (Addendum)
If you are age 66 or older, your body mass index should be between 23-30. Your Body mass index is 45.9 kg/m. If this is out of the aforementioned range listed, please consider follow up with your Primary Care Provider.  If you are age 45 or younger, your body mass index should be between 19-25. Your Body mass index is 45.9 kg/m. If this is out of the aformentioned range listed, please consider follow up with your Primary Care Provider.   You have been scheduled for a CT scan of the abdomen and pelvis at [facility] - 1st floor radiology. You are scheduled on Friday, 11-02-19 at [place] should arrive 15 minutes prior to your appointment time for registration. Please follow the written instructions below on the day of your exam:  WARNING: IF YOU ARE ALLERGIC TO IODINE/X-RAY DYE, PLEASE NOTIFY RADIOLOGY IMMEDIATELY AT [phone]! YOU WILL BE GIVEN A 13 HOUR PREMEDICATION PREP.  1) Do not eat anything after 4:00am (4 hours prior to your test) 2) You need to pick up 2 bottles of oral contrast to drink from [place] at least 3 days before your procedure. The solution may taste better if refrigerated, but do NOT add ice or any other liquid to this solution. Shake well before drinking.    Drink 1 bottle of contrast @ 6:00am (2 hours prior to your exam)  Drink 1 bottle of contrast @ 7:00am (1 hour prior to your exam)  You may take any medications as prescribed with a small amount of water, if necessary. If you take any of the following medications: METFORMIN, GLUCOPHAGE, GLUCOVANCE, AVANDAMET, RIOMET, FORTAMET, [place] MET, JANUMET, GLUMETZA or METAGLIP, you MAY be asked to HOLD this medication 48 hours AFTER the exam.  The purpose of you drinking the oral contrast is to aid in the visualization of your intestinal tract. The contrast solution may cause some diarrhea. Depending on your individual set of symptoms, you may also receive an intravenous injection of x-ray contrast/dye. Plan on  being at [facility] for 30 minutes or longer, depending on the type of exam you are having performed.  This test typically takes 30-45 minutes to complete.  If you have any questions regarding your exam or if you need to reschedule, you may call Radiology between the hours of 8:00 am and 5:00 pm, Monday-Friday [phone]  ___________________________________________________________________   Please go to the lab in the basement of our building to have lab work done as you leave today. Hit "B" for basement when you get on the elevator.  When the doors open the lab is on your left.  We will call you with the results. Thank you.  Thank you for entrusting me with your care and for choosing [facility], Dr. [place] Cellar

## 2019-10-26 NOTE — Progress Notes (Signed)
HPI :  64 year old female known to me from a screening colonoscopy on June 13, 2019, referred back here for heme positive stool.  Patient denies any blood in her stools at all.  She has had regular bowel habits for the most part.  She was hospitalized with sepsis due to renal stone in March.  At that time she received a lot of pain medications and became constipated.  She followed up with her primary care who the patient does not think knew she had a recent colonoscopy, who sent her for fecal occult blood testing - ?FIT for colon cancer screening.  The test returned positive on April 6.  Patient states she otherwise has no complaints and feels about the same in regards to her bowel function which has since normalized now that she has been off the pain medications.  Her colonoscopy was remarkable for 4 polyps, the largest 10 mm in size, consistent with adenomas.  She had diverticulosis, internal hemorrhoids, and a cecal AVM.  The colon was extremely tortuous and it was a difficult exam.  No other concerning pathology appreciated.  Last hemoglobin in March was normal.  She had normal iron studies drawn as well.  Of note when she was admitted to the hospital for urosepsis she had a CT scan done of the abdomen.  No bowel abnormality noted but she did have clustered enlarged periportal and retroperitoneal lymphadenopathy.  She was told she needed a follow-up CT scan when she recovered from her acute issues.  She also had gallstones noted.  She denies any upper abdominal discomfort or postprandial pain.    Colonoscopy 06/13/19 - The perianal and digital rectal examinations were normal. - A single small angiodysplastic lesion was found in the cecum. - Two sessile polyps were found in the ascending colon. The polyps were 6 to 10 mm in size. These polyps were removed with a cold snare. Resection and retrieval were complete. - A 5 mm polyp was found in the descending colon. The polyp was sessile. The  polyp was removed with a cold snare. Resection and retrieval were complete. - A 4 mm polyp was found in the distal rectum. The polyp was sessile. The polyp was removed with a cold snare. Resection and retrieval were complete. - A few small-mouthed diverticula were found in the sigmoid colon. - Anal papilla(e) were hypertrophied. - Internal hemorrhoids were found during retroflexion. - The colon was tortuous with looping, with prolonged the procedure. - The exam was otherwise without abnormality.  Diagnosis 1. Surgical [P], colon, ascending & descending, polyp (3) - TUBULAR ADENOMA(S) WITHOUT HIGH-GRADE DYSPLASIA OR MALIGNANCY - SESSILE SERRATED POLYP(S) WITHOUT CYTOLOGIC DYSPLASIA - HYPERPLASTIC POLYP(S) 2. Surgical [P], colon, rectum, polyp - TUBULAR ADENOMA WITHOUT HIGH-GRADE DYSPLASIA OR MALIGNANCY   Echo 09/25/19 - EF 55-60%  Hgb 12.0 on 08/23/19  Ferritin 235, iron sat 20%, iron 63  FOBT (+) 09/18/19   CT scan: IMPRESSION: 1. Obstructing 5 mm calculus in the left distal ureter with moderate left hydroureteronephrosis. There is asymmetric left renal increased size and left perinephric/periureteral inflammatory change, potentially postobstructive or infectious. Correlation with urinalysis may be useful. 2. Abnormally clustered small and mildly enlarged periportal and retroperitoneal lymph nodes, potentially reactive. Recommend clinical exclusion of an underlying lymphoma. 3. Cholelithiasis and gallbladder hydrops without evidence of acute gallbladder wall thickening or adjacent inflammatory change or fluid. 4. Benign left adrenal adenoma. 5. Remote gastric bypass. 6. Grade 2 anterolisthesis of L5 on S1 with solid osseous fusion and bilateral  pars interarticularis defects. Grade 1 retrolistheses at L3 on L4, L2 on L3, and L1 on L2 that are likely degenerative. 7. Chronic-appearing right hamstring partial tendinous avulsion with muscle atrophy.    Past Medical  History:  Diagnosis Date  . Allergy   . Arthritis   . Asthma    Albuterol inhaler daily as needed  . Cataract    left eye,immature  . Chronic back pain    degenerative arthritis and spondylolisthesis  . Complication of anesthesia    After gastric bypass hours later states she started filling up with fluid and had to be given IV Lasix  . Depression    takes Paxil and Wellbutrin daily  . Diabetes mellitus without complication ([place])    takes Losartan daily to protect kidneys;was on Metformin but has been off since 2010 after gastric bypass  . GERD (gastroesophageal reflux disease)    takes Omeprazole daily  . History of bronchitis 17 yrs ago  . Hyperlipidemia    not on any meds.Last cholesterol was 102 end of Nov 2016  . Insomnia    takes Ambien nightly as needed  . Joint pain   . Joint swelling   . Muscle spasm    takes Flexeril daily  . Numbness    right 4/5 finger and occasionally left leg d/t back pain  . Seasonal allergies    Flovent and Zyrtec daily as needed  . Shortness of breath dyspnea    takes Omeprazole daily  . Sleep apnea    no cpap , elevates HOB   . Urinary frequency   . Urinary urgency      Past Surgical History:  Procedure Laterality Date  . CARPAL TUNNEL RELEASE Bilateral   . COLONOSCOPY    . CYSTOSCOPY WITH RETROGRADE PYELOGRAM, URETEROSCOPY AND STENT PLACEMENT Left 08/21/2019   Procedure: CYSTOSCOPY WITH RETROGRADE PYELOGRAM, URETEROSCOPY AND STENT PLACEMENT;  Surgeon: Ceasar Mons, MD;  Location: [facility];  Service: Urology;  Laterality: Left;  . epidural injections.    Marland Kitchen GASTRIC BYPASS  2009  . partial left knee replacement   2005  . TONSILLECTOMY  1972  . TOTAL KNEE ARTHROPLASTY Right 07/04/2015   Procedure: TOTAL RIGHT KNEE ARTHROPLASTY;  Surgeon: Earlie Server, MD;  Location: [place];  Service: Orthopedics;  Laterality: Right;   Family History  Problem Relation Age of Onset  . Colon polyps Mother   . Colon cancer Neg Hx   .  Esophageal cancer Neg Hx   . Rectal cancer Neg Hx   . Stomach cancer Neg Hx    Social History   Tobacco Use  . Smoking status: Former Research scientist (life sciences)  . Smokeless tobacco: Never Used  . Tobacco comment: quit smoking 10 yrs ago  Substance Use Topics  . Alcohol use: No  . Drug use: No   Current Outpatient Medications  Medication Sig Dispense Refill  . albuterol (PROVENTIL HFA;VENTOLIN HFA) 108 (90 Base) MCG/ACT inhaler Inhale 2 puffs into the lungs every 4 (four) hours as needed for wheezing or shortness of breath.    Marland Kitchen aspirin EC 81 MG tablet Take 81 mg by mouth daily.    Marland Kitchen b complex vitamins tablet Take 1 tablet by mouth daily.    Marland Kitchen buPROPion (WELLBUTRIN XL) 150 MG 24 hr tablet Take 150 mg by mouth daily.    . Cholecalciferol (VITAMIN D3) 5000 units TABS Take 5,000 Units by mouth 2 (two) times daily.     . DULoxetine (CYMBALTA) 60 MG capsule Take 60 mg  by mouth 2 (two) times daily.     . ferrous sulfate 325 (65 FE) MG tablet Take 325 mg by mouth daily with breakfast.    . HYDROcodone-acetaminophen (NORCO) 5-325 MG tablet Take 1 tablet by mouth every 4 (four) hours as needed for moderate pain. 20 tablet 0  . losartan (COZAAR) 25 MG tablet Take 25 mg by mouth daily.    . modafinil (PROVIGIL) 200 MG tablet Take 300 mg by mouth daily.    . Multiple Vitamins-Minerals (MULTIVITAMIN WITH MINERALS) tablet Take 1 tablet by mouth daily.    . Omega-3 Fatty Acids (FISH OIL) 1000 MG CAPS Take 2,000 mg by mouth daily.    Marland Kitchen omeprazole (PRILOSEC) 40 MG capsule Take 40 mg by mouth daily.     . Pitavastatin Calcium 1 MG TABS Take 0.5 tablets by mouth daily.     . polyethylene glycol (MIRALAX / GLYCOLAX) 17 g packet Take 17 g by mouth daily as needed for moderate constipation. 14 each 0  . vitamin B-12 (CYANOCOBALAMIN) 1000 MCG tablet Take 1,000 mcg by mouth daily.     No current facility-administered medications for this visit.   Allergies  Allergen Reactions  . Amoxicillin-Pot Clavulanate Other (See  Comments)    Other Reaction: Other reaction  . Rosuvastatin Other (See Comments)  . Cephalexin Diarrhea  . Latex     Blisters      Review of Systems: All systems reviewed and negative except where noted in HPI.    Labs per HPI  Physical Exam: BP (!) 150/78   Pulse (!) 112   Temp 98.2 F (36.8 C)   Ht 5' (1.524 m)   Wt 235 lb (106.6 kg)   BMI 45.90 kg/m  Constitutional: Pleasant,well-developed, female in no acute distress. Abdominal: Soft, protuberant, nontender. There are no masses palpable.  Lymphadenopathy: No cervical adenopathy noted. Neurological: Alert and oriented to person place and time. Skin: Skin is warm and dry. No rashes noted. Psychiatric: Normal mood and affect. Behavior is normal.   ASSESSMENT AND PLAN: 64 year old female here for reassessment the following:  (+) FIT / history of colon polyps - as above the patient had a positive fit test after a colonoscopy just a few months earlier.  It does not appear her primary care knew that she had a recent colonoscopy.  I reviewed the findings of her colonoscopy with her.  She had a good bowel preparation, 4 polyps removed, diverticulosis, cecal AVM, hemorrhoids.  She did have constipation with straining prior to submitting this, could have had some hemorrhoidal bleeding, or this could be a false positive.  I think the risk of her having a significant lesion in her colon is extremely low.  She has no anemia and her iron studies are normal. I think the yield of a repeat colonoscopy just a few months after her last exam will be quite low, but I offered it to her based on the stool results for peace of mind if she wanted it.  We discussed this for a bit, she agrees that this would likely be low yield and did not want to have a colonoscopy.  We will plan on her next surveillance 3 years from her last colonoscopy as previously recommended.  I emphasized to her that she should not have further stool testing for colon cancer  screening given her history of polyps.  If she has any rectal bleeding or symptoms that bother her, or anemia/iron deficiency then she should contact me in the interim.  Abnormal CT scan abdomen - done in March during hospitalization for urosepsis, she had abnormal lymphadenopathy as outlined, for which a follow-up CT scan was recommended.  I will order this for her with contrast to ensure nothing concerning in this regard.  She agreed, will relay results to her once performed.   [place] Cellar, MD [facility]

## 2019-10-30 ENCOUNTER — Telehealth: Payer: Self-pay | Admitting: Gastroenterology

## 2019-10-30 NOTE — Telephone Encounter (Signed)
Patient calling to see if she need to do additional labs because she just did labs with her PCP

## 2019-10-31 ENCOUNTER — Telehealth: Payer: Self-pay | Admitting: Gastroenterology

## 2019-10-31 NOTE — Telephone Encounter (Signed)
See message from today for additional details.

## 2019-10-31 NOTE — Telephone Encounter (Signed)
Called Radiology and cancelled CT at [facility] on Friday,  5-21 at [place] at [phone]. They can schedule her for Friday, 5-21 at 11:20am at [place], [address]. She will need to arrive at 11:00am. She will drink 1 bottle of contrast at 9:20am and 1 bottle at 10:20am. She can pick up contrast at [place] 8-5:00. Called and spoke to pt.  She is agreeable to date and time and understands to pick up contrast today or tomorrow.  Faxed CMET on 10-26-19 from [facility] to [place] [facility] at [place].

## 2019-10-31 NOTE — Telephone Encounter (Signed)
Cristina Hansen,  Pt is scheduled for CT scan on 11/02/2019 at [facility].  Pt's insurance will not cover [facility]. She will need to be scheduled at [facility] [address] in order for it to be covered

## 2019-10-31 NOTE — Telephone Encounter (Signed)
Jan, I apologize they will not cover [facility], [facility] or [facility].  They will cover [facility] [address] and a facility in [place].

## 2019-11-02 ENCOUNTER — Ambulatory Visit
Admission: RE | Admit: 2019-11-02 | Discharge: 2019-11-02 | Disposition: A | Discharge status: Home / Self Care | Payer: Medicare HMO | Source: Ambulatory Visit | Attending: Gastroenterology | Admitting: Gastroenterology

## 2019-11-02 ENCOUNTER — Ambulatory Visit (HOSPITAL_COMMUNITY): Admission: RE | Admit: 2019-11-02 | Payer: Medicare HMO | Source: Ambulatory Visit

## 2019-11-02 DIAGNOSIS — R933 Abnormal findings on diagnostic imaging of other parts of digestive tract: Secondary | ICD-10-CM

## 2019-11-02 MED ORDER — IOPAMIDOL (ISOVUE-300) INJECTION 61%
100.0000 mL | Freq: Once | INTRAVENOUS | Status: AC | PRN
  Administered 2019-11-02: 100 mL via INTRAVENOUS

## 2019-11-05 ENCOUNTER — Telehealth: Payer: Self-pay | Admitting: Gastroenterology

## 2019-11-05 NOTE — Telephone Encounter (Signed)
Pt returned your call regarding results.  °

## 2019-11-05 NOTE — Telephone Encounter (Signed)
Called and spoke to pt.  See result note from CT

## 2019-11-05 NOTE — Telephone Encounter (Signed)
Patient returned your call about results, please call patient one more time.   

## 2019-11-05 NOTE — Telephone Encounter (Signed)
Pt said she is returning your call Call back @ [phone] or [phone] (cell)

## 2020-04-11 ENCOUNTER — Other Ambulatory Visit: Payer: Self-pay | Admitting: Family Medicine

## 2020-04-11 DIAGNOSIS — Z1231 Encounter for screening mammogram for malignant neoplasm of breast: Secondary | ICD-10-CM

## 2020-05-20 ENCOUNTER — Other Ambulatory Visit: Payer: Self-pay

## 2020-05-20 ENCOUNTER — Ambulatory Visit
Admission: RE | Admit: 2020-05-20 | Discharge: 2020-05-20 | Disposition: A | Discharge status: Home / Self Care | Payer: Medicare HMO | Source: Ambulatory Visit | Attending: Family Medicine | Admitting: Family Medicine

## 2020-05-20 DIAGNOSIS — Z1231 Encounter for screening mammogram for malignant neoplasm of breast: Secondary | ICD-10-CM

## 2021-04-29 ENCOUNTER — Other Ambulatory Visit: Payer: Self-pay | Admitting: Family Medicine

## 2021-04-29 DIAGNOSIS — Z13820 Encounter for screening for osteoporosis: Secondary | ICD-10-CM

## 2021-05-01 ENCOUNTER — Other Ambulatory Visit: Payer: Self-pay | Admitting: Family Medicine

## 2021-05-01 DIAGNOSIS — Z1231 Encounter for screening mammogram for malignant neoplasm of breast: Secondary | ICD-10-CM

## 2021-06-03 ENCOUNTER — Ambulatory Visit
Admission: RE | Admit: 2021-06-03 | Discharge: 2021-06-03 | Disposition: A | Discharge status: Home / Self Care | Payer: Medicare HMO | Source: Ambulatory Visit | Attending: Family Medicine | Admitting: Family Medicine

## 2021-06-03 DIAGNOSIS — Z1231 Encounter for screening mammogram for malignant neoplasm of breast: Secondary | ICD-10-CM

## 2021-09-21 ENCOUNTER — Other Ambulatory Visit (HOSPITAL_COMMUNITY): Payer: Self-pay | Admitting: Orthopedic Surgery

## 2021-09-21 ENCOUNTER — Other Ambulatory Visit: Payer: Self-pay | Admitting: Sports Medicine

## 2021-09-21 ENCOUNTER — Other Ambulatory Visit: Payer: Self-pay | Admitting: Orthopedic Surgery

## 2021-09-21 DIAGNOSIS — M5416 Radiculopathy, lumbar region: Secondary | ICD-10-CM

## 2021-09-21 DIAGNOSIS — T84032A Mechanical loosening of internal right knee prosthetic joint, initial encounter: Secondary | ICD-10-CM

## 2021-09-21 DIAGNOSIS — M4310 Spondylolisthesis, site unspecified: Secondary | ICD-10-CM

## 2021-09-24 ENCOUNTER — Inpatient Hospital Stay: Admission: RE | Admit: 2021-09-24 | Payer: Medicare HMO | Source: Ambulatory Visit

## 2021-09-29 ENCOUNTER — Ambulatory Visit (HOSPITAL_COMMUNITY): Payer: Medicare HMO

## 2021-09-29 ENCOUNTER — Other Ambulatory Visit (HOSPITAL_COMMUNITY): Payer: Medicare HMO

## 2021-09-30 ENCOUNTER — Ambulatory Visit
Admission: RE | Admit: 2021-09-30 | Discharge: 2021-09-30 | Disposition: A | Discharge status: Home / Self Care | Payer: Medicare HMO | Source: Ambulatory Visit | Attending: Family Medicine | Admitting: Family Medicine

## 2021-09-30 DIAGNOSIS — Z13820 Encounter for screening for osteoporosis: Secondary | ICD-10-CM

## 2021-10-02 ENCOUNTER — Ambulatory Visit (HOSPITAL_COMMUNITY)
Admission: RE | Admit: 2021-10-02 | Discharge: 2021-10-02 | Disposition: A | Discharge status: Home / Self Care | Payer: Medicare HMO | Source: Ambulatory Visit | Attending: Orthopedic Surgery | Admitting: Orthopedic Surgery

## 2021-10-02 DIAGNOSIS — T84032A Mechanical loosening of internal right knee prosthetic joint, initial encounter: Secondary | ICD-10-CM | POA: Diagnosis present

## 2021-10-02 MED ORDER — TECHNETIUM TC 99M MEDRONATE IV KIT
20.0000 | PACK | Freq: Once | INTRAVENOUS | Status: AC | PRN
  Administered 2021-10-02: 20 via INTRAVENOUS

## 2021-10-03 ENCOUNTER — Ambulatory Visit
Admission: RE | Admit: 2021-10-03 | Discharge: 2021-10-03 | Disposition: A | Discharge status: Home / Self Care | Payer: Medicare HMO | Source: Ambulatory Visit | Attending: Sports Medicine | Admitting: Sports Medicine

## 2021-10-03 DIAGNOSIS — M5416 Radiculopathy, lumbar region: Secondary | ICD-10-CM

## 2021-10-03 DIAGNOSIS — M4310 Spondylolisthesis, site unspecified: Secondary | ICD-10-CM

## 2021-11-03 ENCOUNTER — Other Ambulatory Visit: Payer: Self-pay | Admitting: Sports Medicine

## 2021-11-03 DIAGNOSIS — S638X2A Sprain of other part of left wrist and hand, initial encounter: Secondary | ICD-10-CM

## 2021-11-03 DIAGNOSIS — S52515A Nondisplaced fracture of left radial styloid process, initial encounter for closed fracture: Secondary | ICD-10-CM

## 2021-11-13 ENCOUNTER — Ambulatory Visit
Admission: RE | Admit: 2021-11-13 | Discharge: 2021-11-13 | Disposition: A | Discharge status: Home / Self Care | Payer: Medicare HMO | Source: Ambulatory Visit | Attending: Sports Medicine | Admitting: Sports Medicine

## 2021-11-13 DIAGNOSIS — S52515A Nondisplaced fracture of left radial styloid process, initial encounter for closed fracture: Secondary | ICD-10-CM

## 2021-11-13 DIAGNOSIS — S638X2A Sprain of other part of left wrist and hand, initial encounter: Secondary | ICD-10-CM

## 2021-11-13 MED ORDER — IOPAMIDOL (ISOVUE-M 200) INJECTION 41%
3.0000 mL | Freq: Once | INTRAMUSCULAR | Status: AC
  Administered 2021-11-13: 3 mL via INTRA_ARTICULAR

## 2022-04-29 ENCOUNTER — Other Ambulatory Visit: Payer: Self-pay | Admitting: Family Medicine

## 2022-04-29 DIAGNOSIS — Z1231 Encounter for screening mammogram for malignant neoplasm of breast: Secondary | ICD-10-CM

## 2022-06-22 ENCOUNTER — Ambulatory Visit
Admission: RE | Admit: 2022-06-22 | Discharge: 2022-06-22 | Disposition: A | Discharge status: Home / Self Care | Payer: Medicare HMO | Source: Ambulatory Visit | Attending: Family Medicine | Admitting: Family Medicine

## 2022-06-22 DIAGNOSIS — Z1231 Encounter for screening mammogram for malignant neoplasm of breast: Secondary | ICD-10-CM

## 2022-06-29 ENCOUNTER — Encounter: Payer: Self-pay | Admitting: Gastroenterology

## 2023-01-14 ENCOUNTER — Encounter: Payer: Self-pay | Admitting: Gastroenterology

## 2023-02-09 ENCOUNTER — Other Ambulatory Visit: Payer: Self-pay

## 2023-02-09 ENCOUNTER — Encounter: Payer: Self-pay | Admitting: Gastroenterology

## 2023-02-09 ENCOUNTER — Ambulatory Visit (AMBULATORY_SURGERY_CENTER): Payer: Medicare HMO

## 2023-02-09 VITALS — Ht 60.0 in | Wt 185.0 lb

## 2023-02-09 DIAGNOSIS — Z86010 Personal history of colonic polyps: Secondary | ICD-10-CM

## 2023-02-09 MED ORDER — NA SULFATE-K SULFATE-MG SULF 17.5-3.13-1.6 GM/177ML PO SOLN: 1.0000 | Freq: Once | ORAL | 0 refills | Status: AC

## 2023-02-09 NOTE — Progress Notes (Signed)
Denies allergies to eggs or soy products. Denies complication of anesthesia or sedation. Denies use of weight loss medication. Denies use of O2.   Emmi instructions given for colonoscopy.  

## 2023-02-28 ENCOUNTER — Ambulatory Visit (AMBULATORY_SURGERY_CENTER): Payer: Medicare HMO | Admitting: Gastroenterology

## 2023-02-28 ENCOUNTER — Encounter: Payer: Self-pay | Admitting: Gastroenterology

## 2023-02-28 VITALS — BP 158/77 | HR 74 | Temp 97.5°F | Resp 14 | Ht 60.0 in | Wt 185.0 lb

## 2023-02-28 DIAGNOSIS — Z09 Encounter for follow-up examination after completed treatment for conditions other than malignant neoplasm: Secondary | ICD-10-CM | POA: Diagnosis present

## 2023-02-28 DIAGNOSIS — D125 Benign neoplasm of sigmoid colon: Secondary | ICD-10-CM

## 2023-02-28 DIAGNOSIS — D123 Benign neoplasm of transverse colon: Secondary | ICD-10-CM

## 2023-02-28 DIAGNOSIS — Z86010 Personal history of colonic polyps: Secondary | ICD-10-CM | POA: Diagnosis not present

## 2023-02-28 DIAGNOSIS — K635 Polyp of colon: Secondary | ICD-10-CM | POA: Diagnosis not present

## 2023-02-28 MED ORDER — SODIUM CHLORIDE 0.9 % IV SOLN: 500.0000 mL | Freq: Once | INTRAVENOUS | Status: DC

## 2023-02-28 NOTE — Progress Notes (Signed)
Pt resting comfortably. VSS. Airway intact. SBAR complete to RN. All questions answered.   

## 2023-02-28 NOTE — Op Note (Signed)
[facility] Patient Name: Cristina Hansen Procedure Date: 02/28/2023 1:54 PM MRN: [id number] Endoscopist: Viviann Spare P. Adela Lank , MD, [id number] Age: 67 Referring MD:  Date of Birth: 1955-10-04 Gender: Female Account #: [id number] Procedure:                Colonoscopy Indications:              High risk colon cancer surveillance: Personal                            history of colonic polyps - 4 adenomas removed                            05/2019 - including one advanced lesion Medicines:                Monitored Anesthesia Care Procedure:                Pre-Anesthesia Assessment:                           - Prior to the procedure, a History and Physical                            was performed, and patient medications and                            allergies were reviewed. The patient's tolerance of                            previous anesthesia was also reviewed. The risks                            and benefits of the procedure and the sedation                            options and risks were discussed with the patient.                            All questions were answered, and informed consent                            was obtained. Prior Anticoagulants: The patient has                            taken no anticoagulant or antiplatelet agents. ASA                            Grade Assessment: III - A patient with severe                            systemic disease. After reviewing the risks and                            benefits, the patient was deemed in satisfactory  condition to undergo the procedure.                           After obtaining informed consent, the colonoscope                            was passed under direct vision. Throughout the                            procedure, the patient's blood pressure, pulse, and                            oxygen saturations were monitored continuously. The                            Olympus Scope  SN: [id number] was introduced through                            the anus and advanced to the the cecum, identified                            by appendiceal orifice and ileocecal valve. The                            colonoscopy was performed without difficulty. The                            patient tolerated the procedure well. The quality                            of the bowel preparation was adequate. The                            ileocecal valve, appendiceal orifice, and rectum                            were photographed. Scope In: 2:00:39 PM Scope Out: 2:26:57 PM Scope Withdrawal Time: 0 hours 16 minutes 57 seconds  Total Procedure Duration: 0 hours 26 minutes 18 seconds  Findings:                 The perianal and digital rectal examinations were                            normal.                           The colon was long and redundant. Prep was adequate                            but significant residual liquid stool in the right                            colon which led to extensive lavage.  A single small angiodysplastic lesion was found in                            the cecum.                           Two sessile polyps were found in the transverse                            colon. The polyps were 3 mm in size. These polyps                            were removed with a cold snare. Resection and                            retrieval were complete.                           Three sessile polyps were found in the sigmoid                            colon. The polyps were 3 mm in size. These polyps                            were removed with a cold snare. Resection and                            retrieval were complete.                           A few small-mouthed diverticula were found in the                            sigmoid colon.                           Internal hemorrhoids were found during                            retroflexion. The  hemorrhoids were small.                           Anal papilla(e) were hypertrophied.                           The exam was otherwise without abnormality. Complications:            No immediate complications. Estimated blood loss:                            Minimal. Estimated Blood Loss:     Estimated blood loss was minimal. Impression:               - Redundant / long colon.                           -  A single colonic angiodysplastic lesion.                           - Two 3 mm polyps in the transverse colon, removed                            with a cold snare. Resected and retrieved.                           - Three 3 mm polyps in the sigmoid colon, removed                            with a cold snare. Resected and retrieved.                           - Diverticulosis in the sigmoid colon.                           - Internal hemorrhoids.                           - Anal papilla(e) were hypertrophied.                           - The examination was otherwise normal. Recommendation:           - Patient has a contact number available for                            emergencies. The signs and symptoms of potential                            delayed complications were discussed with the                            patient. Return to normal activities tomorrow.                            Written discharge instructions were provided to the                            patient.                           - Resume previous diet.                           - Continue present medications.                           - Await pathology results. Viviann Spare P. Galilea Quito, MD 02/28/2023 2:33:25 PM This report has been signed electronically.

## 2023-02-28 NOTE — Patient Instructions (Signed)
Resume previous diet and medications. Awaiting pathology results. Repeat Colonoscopy date to be determined based on pathology results.  YOU HAD AN ENDOSCOPIC PROCEDURE TODAY AT THE [facility]:   Refer to the procedure report that was given to you for any specific questions about what was found during the examination.  If the procedure report does not answer your questions, please call your gastroenterologist to clarify.  If you requested that your care partner not be given the details of your procedure findings, then the procedure report has been included in a sealed envelope for you to review at your convenience later.  YOU SHOULD EXPECT: Some feelings of bloating in the abdomen. Passage of more gas than usual.  Walking can help get rid of the air that was put into your GI tract during the procedure and reduce the bloating. If you had a lower endoscopy (such as a colonoscopy or flexible sigmoidoscopy) you may notice spotting of blood in your stool or on the toilet paper. If you underwent a bowel prep for your procedure, you may not have a normal bowel movement for a few days.  Please Note:  You might notice some irritation and congestion in your nose or some drainage.  This is from the oxygen used during your procedure.  There is no need for concern and it should clear up in a day or so.  SYMPTOMS TO REPORT IMMEDIATELY:  Following lower endoscopy (colonoscopy or flexible sigmoidoscopy):  Excessive amounts of blood in the stool  Significant tenderness or worsening of abdominal pains  Swelling of the abdomen that is new, acute  Fever of 100F or higher   For urgent or emergent issues, a gastroenterologist can be reached at any hour by calling [phone]. Do not use MyChart messaging for urgent concerns.    DIET:  We do recommend a small meal at first, but then you may proceed to your regular diet.  Drink plenty of fluids but you should avoid alcoholic beverages for 24  hours.  ACTIVITY:  You should plan to take it easy for the rest of today and you should NOT DRIVE or use heavy machinery until tomorrow (because of the sedation medicines used during the test).    FOLLOW UP: Our staff will call the number listed on your records the next business day following your procedure.  We will call around 7:15- 8:00 am to check on you and address any questions or concerns that you may have regarding the information given to you following your procedure. If we do not reach you, we will leave a message.     If any biopsies were taken you will be contacted by phone or by letter within the next 1-3 weeks.  Please call us at [phone] if you have not heard about the biopsies in 3 weeks.    SIGNATURES/CONFIDENTIALITY: You and/or your care partner have signed paperwork which will be entered into your electronic medical record.  These signatures attest to the fact that that the information above on your After Visit Summary has been reviewed and is understood.  Full responsibility of the confidentiality of this discharge information lies with you and/or your care-partner. 

## 2023-02-28 NOTE — Progress Notes (Signed)
[facility] History and Physical   Primary Care Physician:  Loyal Jacobson, MD   Reason for Procedure:   History of colon polyps  Plan:    colonoscopy     HPI: Cristina Hansen is a 67 y.o. female  here for colonoscopy surveillance - 4 adenomas removed 05/2019, largest 1cm in size.   Patient denies any bowel symptoms at this time. Mother had colon cancer dx age 54s-80s. Otherwise feels well without any cardiopulmonary symptoms.   I have discussed risks / benefits of anesthesia and endoscopic procedure with Sonda Rumble and they wish to proceed with the exams as outlined today.    Past Medical History:  Diagnosis Date   Allergy    Arthritis    Asthma    Albuterol inhaler daily as needed   Cataract    left eye,immature   Chronic back pain    degenerative arthritis and spondylolisthesis   Chronic kidney disease    Complication of anesthesia    After gastric bypass hours later states she started filling up with fluid and had to be given IV Lasix   Depression    takes Paxil and Wellbutrin daily   Diabetes mellitus without complication (HCC)    takes Losartan daily to protect kidneys;was on Metformin but has been off since 2010 after gastric bypass   GERD (gastroesophageal reflux disease)    takes Omeprazole daily   History of bronchitis 17 yrs ago   Hyperlipidemia    not on any meds.Last cholesterol was 102 end of Nov 2016   Insomnia    takes Ambien nightly as needed   Joint pain    Joint swelling    Muscle spasm    takes Flexeril daily   Numbness    right 4/5 finger and occasionally left leg d/t back pain   Seasonal allergies    Flovent and Zyrtec daily as needed   Shortness of breath dyspnea    takes Omeprazole daily   Sleep apnea    no cpap , elevates HOB    Thyroid disease    Urinary frequency    Urinary urgency     Past Surgical History:  Procedure Laterality Date   CARPAL TUNNEL RELEASE Bilateral    COLONOSCOPY     CYSTOSCOPY WITH  RETROGRADE PYELOGRAM, URETEROSCOPY AND STENT PLACEMENT Left 08/21/2019   Procedure: CYSTOSCOPY WITH RETROGRADE PYELOGRAM, URETEROSCOPY AND STENT PLACEMENT;  Surgeon: Rene Paci, MD;  Location: [facility];  Service: Urology;  Laterality: Left;   epidural injections.     GASTRIC BYPASS  2009   partial left knee replacement   2005   TONSILLECTOMY  1972   TOTAL KNEE ARTHROPLASTY Right 07/04/2015   Procedure: TOTAL RIGHT KNEE ARTHROPLASTY;  Surgeon: Frederico Hamman, MD;  Location: [facility] OR;  Service: Orthopedics;  Laterality: Right;    Prior to Admission medications   Medication Sig Start Date End Date Taking? Authorizing Provider  aspirin EC 81 MG tablet Take 81 mg by mouth daily.   Yes [provider]  b complex vitamins tablet Take 1 tablet by mouth daily.   Yes [provider]  buPROPion (WELLBUTRIN XL) 150 MG 24 hr tablet Take 150 mg by mouth daily.   Yes [provider]  busPIRone (BUSPAR) 15 MG tablet Take 15 mg by mouth 2 (two) times daily.   Yes [provider]  cetirizine (ZYRTEC) 10 MG chewable tablet Chew 10 mg by mouth daily.   Yes [provider]  Cholecalciferol (VITAMIN  D3) 5000 units TABS Take 5,000 Units by mouth 2 (two) times daily.    Yes [provider]  DULoxetine (CYMBALTA) 60 MG capsule Take 60 mg by mouth 2 (two) times daily.  05/01/19  Yes [provider]  losartan (COZAAR) 25 MG tablet Take 25 mg by mouth daily.   Yes [provider]  modafinil (PROVIGIL) 200 MG tablet Take 300 mg by mouth daily.   Yes [provider]  Multiple Vitamins-Minerals (MULTIVITAMIN WITH MINERALS) tablet Take 1 tablet by mouth daily.   Yes [provider]  Omega-3 Fatty Acids (FISH OIL) 1000 MG CAPS Take 2,000 mg by mouth daily.   Yes [provider]  omeprazole (PRILOSEC) 40 MG capsule Take 40 mg by mouth daily.   Yes [provider]  vitamin B-12 (CYANOCOBALAMIN) 1000 MCG tablet  Take 1,000 mcg by mouth daily.   Yes [provider]  albuterol (PROVENTIL HFA;VENTOLIN HFA) 108 (90 Base) MCG/ACT inhaler Inhale 2 puffs into the lungs every 4 (four) hours as needed for wheezing or shortness of breath.    [provider]  colchicine 0.6 MG tablet Take 0.6 mg by mouth 2 (two) times daily.    [provider]  ferrous sulfate 325 (65 FE) MG tablet Take 325 mg by mouth daily with breakfast. Patient not taking: Reported on 02/09/2023    [provider]  HYDROcodone-acetaminophen (NORCO) 5-325 MG tablet Take 1 tablet by mouth every 4 (four) hours as needed for moderate pain. 08/21/19   Rene Paci, MD    Current Outpatient Medications  Medication Sig Dispense Refill   aspirin EC 81 MG tablet Take 81 mg by mouth daily.     b complex vitamins tablet Take 1 tablet by mouth daily.     buPROPion (WELLBUTRIN XL) 150 MG 24 hr tablet Take 150 mg by mouth daily.     busPIRone (BUSPAR) 15 MG tablet Take 15 mg by mouth 2 (two) times daily.     cetirizine (ZYRTEC) 10 MG chewable tablet Chew 10 mg by mouth daily.     Cholecalciferol (VITAMIN D3) 5000 units TABS Take 5,000 Units by mouth 2 (two) times daily.      DULoxetine (CYMBALTA) 60 MG capsule Take 60 mg by mouth 2 (two) times daily.      losartan (COZAAR) 25 MG tablet Take 25 mg by mouth daily.     modafinil (PROVIGIL) 200 MG tablet Take 300 mg by mouth daily.     Multiple Vitamins-Minerals (MULTIVITAMIN WITH MINERALS) tablet Take 1 tablet by mouth daily.     Omega-3 Fatty Acids (FISH OIL) 1000 MG CAPS Take 2,000 mg by mouth daily.     omeprazole (PRILOSEC) 40 MG capsule Take 40 mg by mouth daily.     vitamin B-12 (CYANOCOBALAMIN) 1000 MCG tablet Take 1,000 mcg by mouth daily.     albuterol (PROVENTIL HFA;VENTOLIN HFA) 108 (90 Base) MCG/ACT inhaler Inhale 2 puffs into the lungs every 4 (four) hours as needed for wheezing or shortness of breath.     colchicine 0.6 MG tablet Take 0.6 mg by  mouth 2 (two) times daily.     ferrous sulfate 325 (65 FE) MG tablet Take 325 mg by mouth daily with breakfast. (Patient not taking: Reported on 02/09/2023)     HYDROcodone-acetaminophen (NORCO) 5-325 MG tablet Take 1 tablet by mouth every 4 (four) hours as needed for moderate pain. 20 tablet 0   Current Facility-Administered Medications  Medication Dose Route Frequency Provider  Last Rate Last Admin   0.9 %  sodium chloride infusion  500 mL Intravenous Once Kristilyn Coltrane, Willaim Rayas, MD        Allergies as of 02/28/2023 - Review Complete 02/28/2023  Allergen Reaction Noted   Rosuvastatin Other (See Comments) 12/27/2017   Cephalexin Diarrhea 08/14/2015   Latex  06/26/2015    Family History  Problem Relation Age of Onset   Colon cancer Mother    Colon polyps Mother    Esophageal cancer Neg Hx    Rectal cancer Neg Hx    Stomach cancer Neg Hx    Breast cancer Neg Hx     Social History   Socioeconomic History   Marital status: Single    Spouse name: Not on file   Number of children: Not on file   Years of education: Not on file   Highest education level: Not on file  Occupational History   Not on file  Tobacco Use   Smoking status: Former   Smokeless tobacco: Never   Tobacco comments:    quit smoking 10 yrs ago  Substance and Sexual Activity   Alcohol use: No   Drug use: No   Sexual activity: Yes    Birth control/protection: Surgical  Other Topics Concern   Not on file  Social History Narrative   Not on file   Social Determinants of Health   Financial Resource Strain: Not on file  Food Insecurity: Not on file  Transportation Needs: Not on file  Physical Activity: Not on file  Stress: Not on file  Social Connections: Not on file  Intimate Partner Violence: Not on file    Review of Systems: All other review of systems negative except as mentioned in the HPI.  Physical Exam: Vital signs BP 115/65   Pulse 77   Temp (!) 97.5 F (36.4 C)   Ht 5' (1.524 m)   Wt  185 lb (83.9 kg)   SpO2 97%   BMI 36.13 kg/m   General:   Alert,  Well-developed, pleasant and cooperative in NAD Lungs:  Clear throughout to auscultation.   Heart:  Regular rate and rhythm Abdomen:  Soft, nontender and nondistended.   Neuro/Psych:  Alert and cooperative. Normal mood and affect. A and O x 3  Harlin Rain, MD [facility]

## 2023-02-28 NOTE — Progress Notes (Signed)
Called to room to assist during endoscopic procedure.  Patient ID and intended procedure confirmed with present staff. Received instructions for my participation in the procedure from the performing physician.  

## 2023-02-28 NOTE — Progress Notes (Signed)
I have reviewed the patient's medical history in detail and updated the computerized patient record.

## 2023-03-01 ENCOUNTER — Telehealth: Payer: Self-pay

## 2023-03-01 NOTE — Telephone Encounter (Signed)
Follow up call to pt, lm for pt to call if having any difficulty with normal activities or eating and drinking.  Also to call if any other questions or concerns.  

## 2023-03-07 LAB — SURGICAL PATHOLOGY

## 2023-03-08 ENCOUNTER — Encounter: Payer: Self-pay | Admitting: Gastroenterology

## 2023-07-01 ENCOUNTER — Other Ambulatory Visit: Payer: Self-pay | Admitting: Family Medicine

## 2023-07-01 DIAGNOSIS — Z1231 Encounter for screening mammogram for malignant neoplasm of breast: Secondary | ICD-10-CM

## 2023-07-18 ENCOUNTER — Ambulatory Visit
Admission: RE | Admit: 2023-07-18 | Discharge: 2023-07-18 | Disposition: A | Discharge status: Home / Self Care | Payer: Medicare HMO | Source: Ambulatory Visit | Attending: Family Medicine | Admitting: Family Medicine

## 2023-07-18 DIAGNOSIS — Z1231 Encounter for screening mammogram for malignant neoplasm of breast: Secondary | ICD-10-CM

## 2023-09-08 IMAGING — XA DG FLUORO GUIDE NDL PLC/BX
4 series · 4 of 4 positions shown · non-contrast
Comparison: none

CLINICAL DATA: Closed nondisplaced fracture of styloid process of
left radius. Persistent wrist pain.

[Series 1: ortho standard · 1 of 1 slices shown (1 of 4)]
[im 1/1]
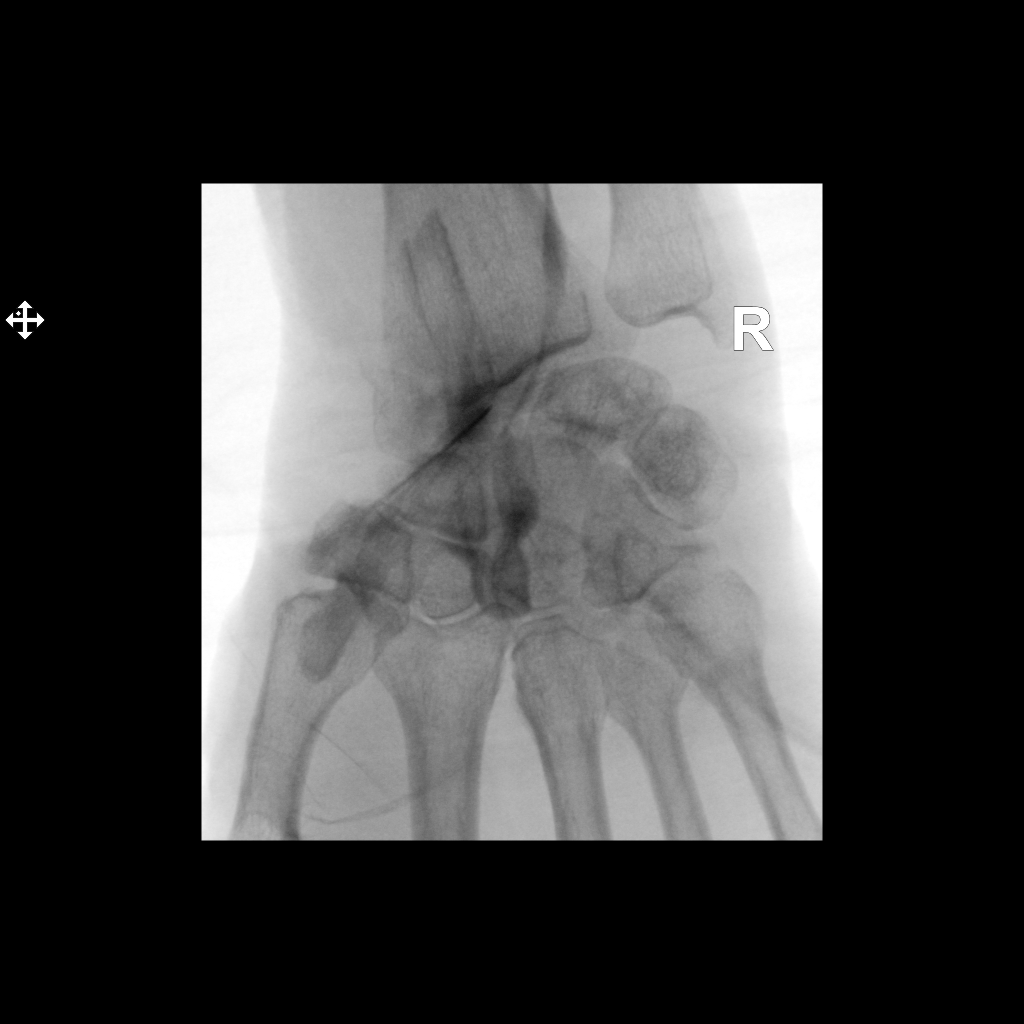

[Series 2: ortho standard · 1 of 1 slices shown (2 of 4)]
[im 1/1]
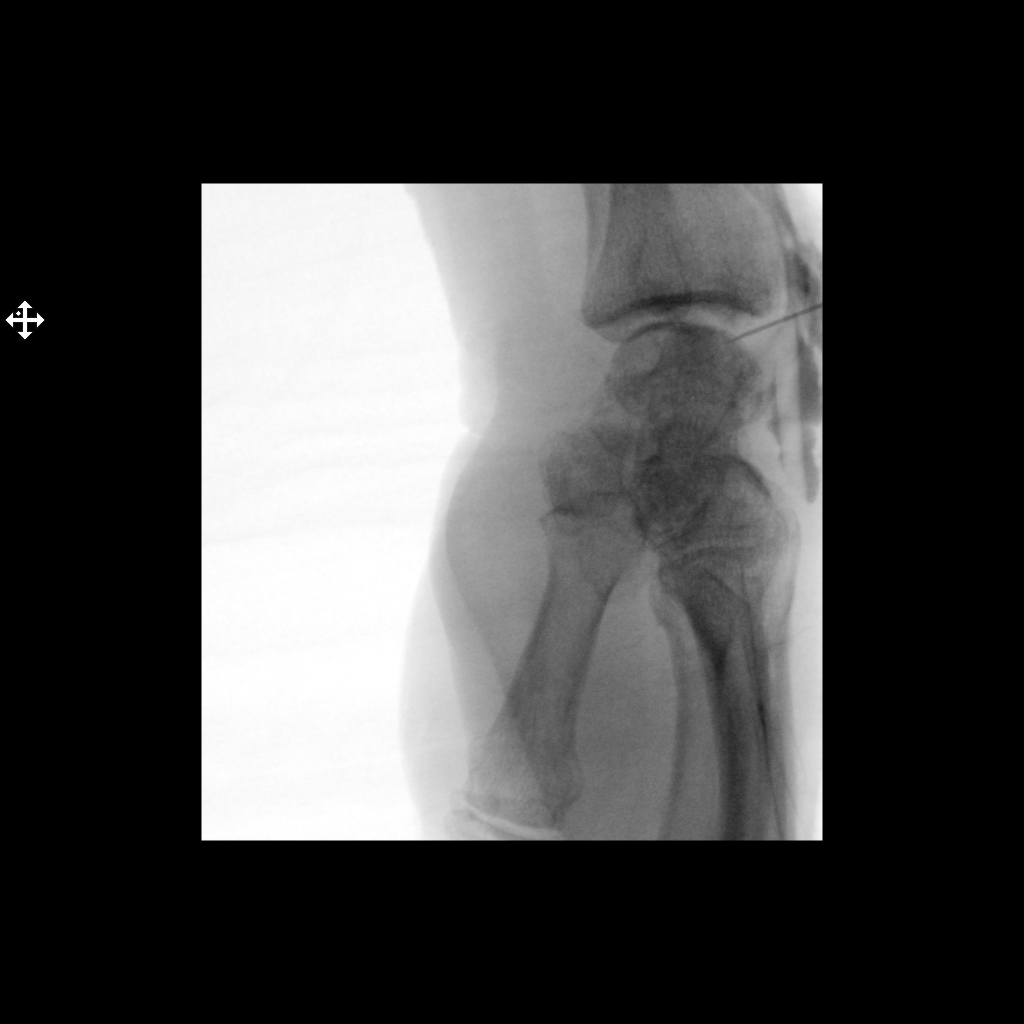

[Series 3: ortho standard · 1 of 1 slices shown (3 of 4)]
[im 1/1]
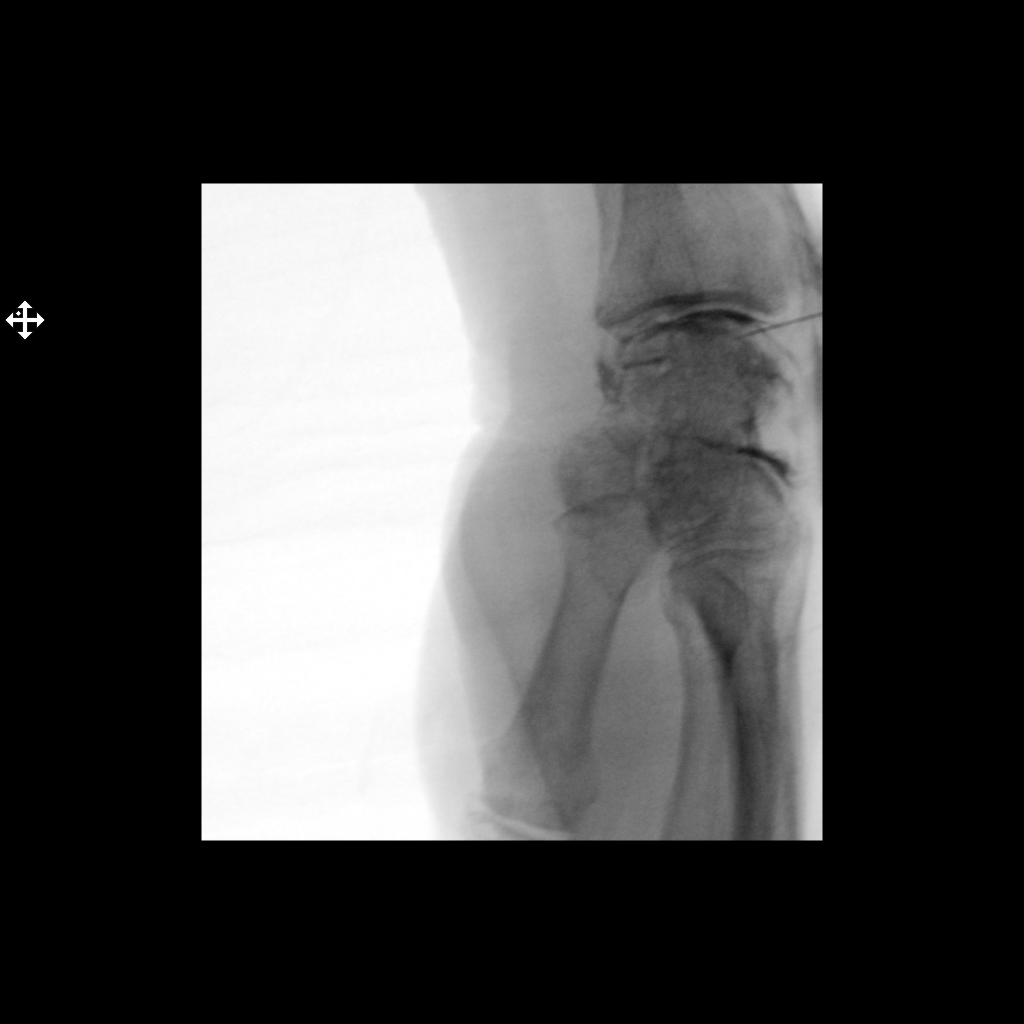

[Series 4: ortho standard · 1 of 1 slices shown (4 of 4)]
[im 1/1]
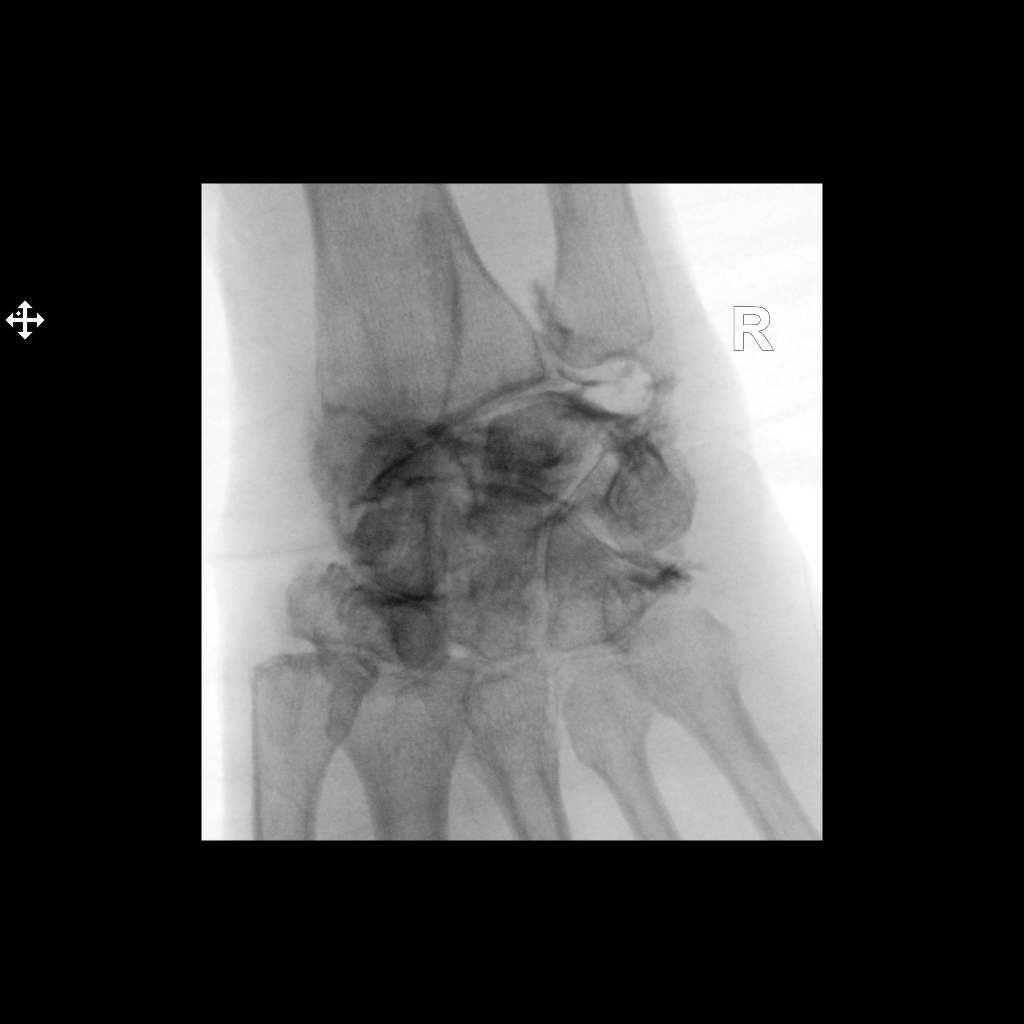

[4 of 4 positions shown; findings below may reference images not displayed]

FLUOROSCOPY:
Radiation Exposure Index (as provided by the fluoroscopic device):
Dose area product 5.17 uGy*m2

PROCEDURE:
Left WRIST INJECTION UNDER FLUOROSCOPY

An appropriate skin entrance site was determined. The site was
marked, prepped with Betadine, draped in the usual sterile fashion,
and infiltrated locally with 1% Lidocaine. A 25 gauge skin needle
was advanced into the radiocarpal joint under intermittent
fluoroscopy. A mixture of 0.1 ml Multihance and 20 mL of dilute
Isovue M 200 was then used to opacify the proximal carpal joint. No
immediate complication.
IMPRESSION: Technically successful left wrist injection for MRI.
